# Patient Record
Sex: Female | Born: 1975 | Race: Black or African American | Hispanic: No | Marital: Married | Smoking: Never smoker
Health system: Southern US, Community
[De-identification: ages and names within clinical notes are randomized; demographics above are authoritative.]

## PROBLEM LIST (undated history)

## (undated) ENCOUNTER — Inpatient Hospital Stay (HOSPITAL_COMMUNITY): Payer: Self-pay

## (undated) DIAGNOSIS — O009 Unspecified ectopic pregnancy without intrauterine pregnancy: Secondary | ICD-10-CM

## (undated) DIAGNOSIS — R011 Cardiac murmur, unspecified: Secondary | ICD-10-CM

## (undated) DIAGNOSIS — Z973 Presence of spectacles and contact lenses: Secondary | ICD-10-CM

## (undated) DIAGNOSIS — D259 Leiomyoma of uterus, unspecified: Secondary | ICD-10-CM

## (undated) DIAGNOSIS — Z789 Other specified health status: Secondary | ICD-10-CM

## (undated) HISTORY — PX: NO PAST SURGERIES: SHX2092

---

## 2000-11-04 ENCOUNTER — Encounter: Admission: RE | Admit: 2000-11-04 | Discharge: 2000-11-04 | Payer: Self-pay | Admitting: Family Medicine

## 2000-11-04 ENCOUNTER — Encounter: Payer: Self-pay | Admitting: Family Medicine

## 2002-02-02 ENCOUNTER — Other Ambulatory Visit: Admission: RE | Admit: 2002-02-02 | Discharge: 2002-02-02 | Payer: Self-pay | Admitting: Family Medicine

## 2003-07-06 ENCOUNTER — Other Ambulatory Visit: Admission: RE | Admit: 2003-07-06 | Discharge: 2003-07-06 | Payer: Self-pay | Admitting: Family Medicine

## 2012-09-29 DIAGNOSIS — O009 Unspecified ectopic pregnancy without intrauterine pregnancy: Secondary | ICD-10-CM

## 2012-09-29 HISTORY — DX: Unspecified ectopic pregnancy without intrauterine pregnancy: O00.90

## 2012-10-18 ENCOUNTER — Encounter (HOSPITAL_COMMUNITY): Payer: Self-pay

## 2012-10-18 ENCOUNTER — Inpatient Hospital Stay (HOSPITAL_COMMUNITY): Payer: BC Managed Care – PPO

## 2012-10-18 ENCOUNTER — Inpatient Hospital Stay (HOSPITAL_COMMUNITY)
Admission: AD | Admit: 2012-10-18 | Discharge: 2012-10-18 | Disposition: A | Discharge status: Home / Self Care | Payer: BC Managed Care – PPO | Source: Ambulatory Visit | Attending: Obstetrics & Gynecology | Admitting: Obstetrics & Gynecology

## 2012-10-18 DIAGNOSIS — O209 Hemorrhage in early pregnancy, unspecified: Secondary | ICD-10-CM

## 2012-10-18 DIAGNOSIS — D259 Leiomyoma of uterus, unspecified: Secondary | ICD-10-CM

## 2012-10-18 DIAGNOSIS — R109 Unspecified abdominal pain: Secondary | ICD-10-CM

## 2012-10-18 DIAGNOSIS — O3410 Maternal care for benign tumor of corpus uteri, unspecified trimester: Secondary | ICD-10-CM

## 2012-10-18 HISTORY — DX: Other specified health status: Z78.9

## 2012-10-18 LAB — WET PREP, GENITAL
Clue Cells Wet Prep HPF POC: NONE SEEN
Trich, Wet Prep: NONE SEEN
WBC, Wet Prep HPF POC: NONE SEEN
Yeast Wet Prep HPF POC: NONE SEEN

## 2012-10-18 LAB — POCT PREGNANCY, URINE: Preg Test, Ur: POSITIVE — AB

## 2012-10-18 LAB — ABO/RH: ABO/RH(D): A POS

## 2012-10-18 LAB — HCG, QUANTITATIVE, PREGNANCY: hCG, Beta Chain, Quant, S: 317 m[IU]/mL — ABNORMAL HIGH (ref <5)

## 2012-10-18 NOTE — MAU Note (Signed)
In bathroom when called

## 2012-10-18 NOTE — MAU Note (Signed)
Vag bleeding since Wed.  Couldn't get in to OB/GYN, saw regular primary dr.  Kenard Gower blood, confirmed preg- no [place] in office.  Had really bad cramping last Wed and Thurs, now is mild.

## 2012-10-18 NOTE — MAU Provider Note (Signed)
Attestation of Attending Supervision of Advanced Practitioner (CNM/NP): Evaluation and management procedures were performed by the Advanced Practitioner under my supervision and collaboration.  I have reviewed the Advanced Practitioner's note and chart, and I agree with the management and plan.  HARRAWAY-SMITH, Sebastian Lurz 8:44 PM     

## 2012-10-18 NOTE — MAU Provider Note (Signed)
History     CSN: [id number]  Arrival date and time: 10/18/12 1707   None     Chief Complaint  Patient presents with  . Vaginal Bleeding  . Abdominal Cramping   HPI 37 y.o. G2P0010 at [redacted]w[redacted]d with vaginal bleeding x a few days, getting heavier today, + abdominal pain/cramping. Has had HCG levels drawn in her PCP's office Friday and today, but does not know what those results showed. PCP told her to come here d/t bleeding and pain.   Past Medical History  Diagnosis Date  . No pertinent past medical history     Past Surgical History  Procedure Date  . No past surgeries     Family History  Problem Relation Age of Onset  . Other Neg Hx     History  Substance Use Topics  . Smoking status: Never Smoker   . Smokeless tobacco: Not on file  . Alcohol Use: No    Allergies: No Known Allergies  Prescriptions prior to admission  Medication Sig Dispense Refill  . acetaminophen-codeine (TYLENOL #3) 300-30 MG per tablet Take 1 tablet by mouth every 4 (four) hours as needed.      . Multiple Vitamin (MULTIVITAMIN WITH MINERALS) TABS Take 1 tablet by mouth daily.      . Norgestimate-Ethinyl Estradiol Triphasic (ORTHO TRI-CYCLEN LO) 0.18/0.215/0.25 MG-25 MCG tab Take 1 tablet by mouth daily.        Review of Systems  Constitutional: Negative.   Respiratory: Negative.   Cardiovascular: Negative.   Gastrointestinal: Positive for abdominal pain. Negative for nausea, vomiting, diarrhea and constipation.  Genitourinary: Negative for dysuria, urgency, frequency, hematuria and flank pain.       Positive for vaginal bleeding   Musculoskeletal: Negative.   Neurological: Negative.   Psychiatric/Behavioral: Negative.    Physical Exam   Blood pressure 129/81, pulse 87, temperature 98.4 F (36.9 C), temperature source Oral, resp. rate 18, last menstrual period 08/29/2012.  Physical Exam  Constitutional: She is oriented to person, place, and time. She appears well-developed and  well-nourished. No distress.  HENT:  Head: Normocephalic and atraumatic.  Cardiovascular: Normal rate, regular rhythm and normal heart sounds.   Respiratory: Effort normal and breath sounds normal. No respiratory distress.  GI: Soft. Bowel sounds are normal. She exhibits no distension and no mass. There is no tenderness. There is no rebound and no guarding.  Genitourinary: There is no rash or lesion on the right labia. There is no rash or lesion on the left labia. Uterus is not deviated, not enlarged, not fixed and not tender. Cervix exhibits no motion tenderness, no discharge and no friability. Right adnexum displays no mass, no tenderness and no fullness. Left adnexum displays no mass, no tenderness and no fullness. There is bleeding (moderate) around the vagina. No erythema or tenderness around the vagina. No vaginal discharge found.  Neurological: She is alert and oriented to person, place, and time.  Skin: Skin is warm and dry.  Psychiatric: She has a normal mood and affect.    MAU Course  Procedures  Results for orders placed during the hospital encounter of 10/18/12 (from the past 24 hour(s))  HCG, QUANTITATIVE, PREGNANCY     Status: Abnormal   Collection Time   10/18/12  6:15 PM      Component Value Range   hCG, Beta Chain, Quant, S 317 (*) <5 mIU/mL  ABO/RH     Status: Normal (Preliminary result)   Collection Time   10/18/12  6:15 PM  Component Value Range   ABO/RH(D) A POS    POCT PREGNANCY, URINE     Status: Abnormal   Collection Time   10/18/12  7:02 PM      Component Value Range   Preg Test, Ur POSITIVE (*) NEGATIVE     Assessment and Plan  U/S Pending Care assumed by Wynelle Bourgeois, CNM  [facility] 10/18/2012, 7:42 PM   [place]:  Multiple fibroids        No Gestational sac seen         No YS or fetal pole  Will repeat quant Hcg in 2 days.

## 2012-10-19 LAB — GC/CHLAMYDIA PROBE AMP
CT Probe RNA: NEGATIVE
GC Probe RNA: NEGATIVE

## 2012-10-20 ENCOUNTER — Inpatient Hospital Stay (HOSPITAL_COMMUNITY)
Admission: AD | Admit: 2012-10-20 | Discharge: 2012-10-20 | Disposition: A | Discharge status: Home / Self Care | Payer: BC Managed Care – PPO | Source: Ambulatory Visit | Attending: Obstetrics & Gynecology | Admitting: Obstetrics & Gynecology

## 2012-10-20 DIAGNOSIS — R109 Unspecified abdominal pain: Secondary | ICD-10-CM | POA: Insufficient documentation

## 2012-10-20 DIAGNOSIS — O034 Incomplete spontaneous abortion without complication: Secondary | ICD-10-CM

## 2012-10-20 DIAGNOSIS — O209 Hemorrhage in early pregnancy, unspecified: Secondary | ICD-10-CM | POA: Insufficient documentation

## 2012-10-20 LAB — HCG, QUANTITATIVE, PREGNANCY: hCG, Beta Chain, Quant, S: 369 m[IU]/mL — ABNORMAL HIGH (ref <5)

## 2012-10-20 NOTE — MAU Provider Note (Signed)
  History     CSN: [id number]  Arrival date and time: 10/20/12 1729   None     Chief Complaint  Patient presents with  . Follow-up   HPI This is a 37 y.o. female at [redacted]w[redacted]d who presents for followup Quant HCG. She was seen here two days ago for bleeding and cramping. Quant was 317. Has bled for 7 days, heavier with clots past 2 days.   OB History    Grav Para Term Preterm Abortions TAB SAB Ect Mult Living   2 0 0 0 1 1 0 0 0 0       Past Medical History  Diagnosis Date  . No pertinent past medical history     Past Surgical History  Procedure Date  . No past surgeries     Family History  Problem Relation Age of Onset  . Other Neg Hx     History  Substance Use Topics  . Smoking status: Never Smoker   . Smokeless tobacco: Not on file  . Alcohol Use: No    Allergies: No Known Allergies  Prescriptions prior to admission  Medication Sig Dispense Refill  . acetaminophen-codeine (TYLENOL #3) 300-30 MG per tablet Take 1 tablet by mouth every 4 (four) hours as needed.      . Multiple Vitamin (MULTIVITAMIN WITH MINERALS) TABS Take 1 tablet by mouth daily.      . Norgestimate-Ethinyl Estradiol Triphasic (ORTHO TRI-CYCLEN LO) 0.18/0.215/0.25 MG-25 MCG tab Take 1 tablet by mouth daily.        Review of Systems  Constitutional: Positive for malaise/fatigue. Negative for fever and chills.  Cardiovascular: Negative for chest pain.  Gastrointestinal: Positive for abdominal pain (uterine cramps). Negative for nausea, vomiting, diarrhea and constipation.  Genitourinary: Negative for dysuria.  Neurological: Negative for dizziness and weakness.   Physical Exam   Blood pressure 137/76, pulse 86, temperature 98.5 F (36.9 C), temperature source Oral, resp. rate 16, last menstrual period 08/29/2012, SpO2 100.00%.  Physical Exam  Constitutional: She is oriented to person, place, and time. She appears well-developed and well-nourished. No distress.  Cardiovascular: Normal rate.     Respiratory: Effort normal.  Musculoskeletal: Normal range of motion.  Neurological: She is alert and oriented to person, place, and time.  Skin: Skin is warm and dry.  Psychiatric: She has a normal mood and affect.  Pelvic deferred  MAU Course  Procedures  MDM Results for orders placed during the hospital encounter of 10/20/12 (from the past 24 hour(s))  HCG, QUANTITATIVE, PREGNANCY     Status: Abnormal   Collection Time   10/20/12  5:55 PM      Component Value Range   hCG, Beta Chain, Quant, S 369 (*) <5 mIU/mL   Last quant 317 two days ago.   Assessment and Plan  A:  Pregnancy at [redacted]w[redacted]d by LMP      Nothing seen in uterus on [place] two days ago      Insufficient rise in Quantitative HCG levels  P:  Discussed poor prognosis with pt and FOB      Offered expectant mgmt vs Cytotec, pt will continue expectant for now (I suspect process may be over soon)       Repeat Quant and CBC on Monday.       Bleeding precautions:  If heavier, come in sooner than Monday  [facility] 10/20/2012, 7:34 PM

## 2012-10-20 NOTE — MAU Note (Signed)
Patient to MAU for repeat BHCG. Patient states she has changed 3 pads today, 5 yesterday with multiple blood clots moderate in size. Having cramping off and on.

## 2012-10-25 ENCOUNTER — Inpatient Hospital Stay (HOSPITAL_COMMUNITY): Payer: BC Managed Care – PPO

## 2012-10-25 ENCOUNTER — Inpatient Hospital Stay (HOSPITAL_COMMUNITY)
Admission: AD | Admit: 2012-10-25 | Discharge: 2012-10-25 | Disposition: A | Discharge status: Home / Self Care | Payer: BC Managed Care – PPO | Source: Ambulatory Visit | Attending: Obstetrics & Gynecology | Admitting: Obstetrics & Gynecology

## 2012-10-25 DIAGNOSIS — O0281 Inappropriate change in quantitative human chorionic gonadotropin (hCG) in early pregnancy: Secondary | ICD-10-CM

## 2012-10-25 DIAGNOSIS — O99891 Other specified diseases and conditions complicating pregnancy: Secondary | ICD-10-CM | POA: Insufficient documentation

## 2012-10-25 DIAGNOSIS — O00109 Unspecified tubal pregnancy without intrauterine pregnancy: Secondary | ICD-10-CM | POA: Insufficient documentation

## 2012-10-25 DIAGNOSIS — O009 Unspecified ectopic pregnancy without intrauterine pregnancy: Secondary | ICD-10-CM

## 2012-10-25 LAB — AST: AST: 20 U/L (ref 0–37)

## 2012-10-25 LAB — HCG, QUANTITATIVE, PREGNANCY: hCG, Beta Chain, Quant, S: 291 m[IU]/mL — ABNORMAL HIGH (ref <5)

## 2012-10-25 LAB — DIFFERENTIAL
Basophils Relative: 0 % (ref 0–1)
Eosinophils Absolute: 0.1 10*3/uL (ref 0.0–0.7)
Eosinophils Relative: 1 % (ref 0–5)
Lymphs Abs: 2 10*3/uL (ref 0.7–4.0)
Monocytes Absolute: 0.6 10*3/uL (ref 0.1–1.0)
Monocytes Relative: 10 % (ref 3–12)

## 2012-10-25 LAB — CBC
HCT: 35.5 % — ABNORMAL LOW (ref 36.0–46.0)
Platelets: 403 10*3/uL — ABNORMAL HIGH (ref 150–400)
RDW: 13.2 % (ref 11.5–15.5)
WBC: 6.4 10*3/uL (ref 4.0–10.5)

## 2012-10-25 LAB — BUN: BUN: 11 mg/dL (ref 6–23)

## 2012-10-25 LAB — CREATININE, SERUM: Creatinine, Ser: 0.87 mg/dL (ref 0.50–1.10)

## 2012-10-25 MED ORDER — METHOTREXATE INJECTION FOR WOMEN'S HOSPITAL
50.0000 mg/m2 | Freq: Once | INTRAMUSCULAR | Status: AC
  Administered 2012-10-25: 85 mg via INTRAMUSCULAR
  Filled 2012-10-25: qty 1.7

## 2012-10-25 NOTE — MAU Provider Note (Signed)
Attestation of Attending Supervision of Advanced Practitioner (CNM/NP): Evaluation and management procedures were performed by the Advanced Practitioner under my supervision and collaboration. I have reviewed the Advanced Practitioner's note and chart, and I agree with the management and plan.  Phares Zaccone H. 10:44 PM   

## 2012-10-25 NOTE — MAU Provider Note (Signed)
Chief Complaint: Follow-up   None    SUBJECTIVE HPI: Darlene Berg is a 37 y.o. G2P0010 at [redacted]w[redacted]d by LMP who presents to maternity admissions for f/u quant hcg.  Today she reports mild back pain and denies abdominal pain, n/v, fever/chills, or vaginal bleeding.  She did report bright red vaginal bleeding x5 days which resolved on 1/25.     Past Medical History  Diagnosis Date  . No pertinent past medical history    Past Surgical History  Procedure Date  . No past surgeries    History   Social History  . Marital Status: Single    Spouse Name: N/A    Number of Children: N/A  . Years of Education: N/A   Occupational History  . Not on file.   Social History Main Topics  . Smoking status: Never Smoker   . Smokeless tobacco: Not on file  . Alcohol Use: No  . Drug Use: No  . Sexually Active: Yes    Birth Control/ Protection: Pill   Other Topics Concern  . Not on file   Social History Narrative  . No narrative on file   No current facility-administered medications on file prior to encounter.   Current Outpatient Prescriptions on File Prior to Encounter  Medication Sig Dispense Refill  . acetaminophen-codeine (TYLENOL #3) 300-30 MG per tablet Take 1 tablet by mouth every 4 (four) hours as needed.      . Multiple Vitamin (MULTIVITAMIN WITH MINERALS) TABS Take 1 tablet by mouth daily.       No Known Allergies  ROS: Pertinent items in HPI  OBJECTIVE Blood pressure 113/61, pulse 83, temperature 98.4 F (36.9 C), temperature source Oral, resp. rate 16, last menstrual period 08/29/2012, SpO2 100.00%. GENERAL: Well-developed, well-nourished female in no acute distress.  HEENT: Normocephalic HEART: normal rate RESP: normal effort ABDOMEN: Soft, non-tender EXTREMITIES: Nontender, no edema NEURO: Alert and oriented SPECULUM EXAM: Deferred--done on 10/18/12  LAB RESULTS  Quant hcg results 1/20: 317 Quant hcg results 1/22: 369   Results for orders placed during the  hospital encounter of 10/25/12 (from the past 24 hour(s))  HCG, QUANTITATIVE, PREGNANCY     Status: Abnormal   Collection Time   10/25/12  5:45 PM      Component Value Range   hCG, Beta Chain, Quant, S 291 (*) <5 mIU/mL  CBC     Status: Abnormal   Collection Time   10/25/12  5:52 PM      Component Value Range   WBC 6.4  4.0 - 10.5 K/uL   RBC 3.84 (*) 3.87 - 5.11 MIL/uL   Hemoglobin 11.4 (*) 12.0 - 15.0 g/dL   HCT 16.1 (*) 09.6 - 04.5 %   MCV 92.4  78.0 - 100.0 fL   MCH 29.7  26.0 - 34.0 pg   MCHC 32.1  30.0 - 36.0 g/dL   RDW 40.9  81.1 - 91.4 %   Platelets 403 (*) 150 - 400 K/uL    IMAGING   US Ob Transvaginal  10/25/2012  *RADIOLOGY REPORT*  Clinical Data: Abdominal pain, bleeding, beta HCG 291 (previously 369 on 10/20/2012)  TRANSVAGINAL OBSTETRIC US  Technique:  Transvaginal ultrasound was performed for complete evaluation of the gestation as well as the maternal uterus, adnexal regions, and pelvic cul-de-sac.  Comparison:  10/19/2011.  Intrauterine gestational sac: Not visualized.  Maternal uterus/adnexae: Uterus is notable for uterine fibroids.  Endometrial complex measures 6 mm.  Left ovary is within normal limits, measuring 1.7 x  3.2 x 2.3 cm.  Right ovary is within normal limits, measuring 1.8 x 3.4 x 2.1 cm.  Small volume pelvic ascites.  IMPRESSION: No IUP is visualized.  This reflects a pregnancy of unknown location.  Differential considerations include abnormal IUP (favored) or less likely nonvisualized ectopic pregnancy.  Early normal IUP is not considered likely in the setting of a declining beta HCG.  Follow up beta HCG is suggested, with repeat sonography if clinically warranted.   Original Report Authenticated By: Charline Bills, M.D.      ASSESSMENT 1. Inappropriate change in quantitative hCG in early pregnancy   2. Ectopic pregnancy     PLAN Discussed assessment and results with Dr Penne Lash Methotrexate offered for probable ectopic pregnancy Discussed ectopic  pregnancy, risks, and methotrexate benefits/risks with pt Care assumed by [facility], NP  Sharen Counter Certified Nurse-Midwife 10/25/2012  8:53 PM  Results for orders placed during the hospital encounter of 10/25/12 (from the past 24 hour(s))  HCG, QUANTITATIVE, PREGNANCY     Status: Abnormal   Collection Time   10/25/12  5:45 PM      Component Value Range   hCG, Beta Chain, Quant, S 291 (*) <5 mIU/mL  CBC     Status: Abnormal   Collection Time   10/25/12  5:52 PM      Component Value Range   WBC 6.4  4.0 - 10.5 K/uL   RBC 3.84 (*) 3.87 - 5.11 MIL/uL   Hemoglobin 11.4 (*) 12.0 - 15.0 g/dL   HCT 14.7 (*) 82.9 - 56.2 %   MCV 92.4  78.0 - 100.0 fL   MCH 29.7  26.0 - 34.0 pg   MCHC 32.1  30.0 - 36.0 g/dL   RDW 13.0  86.5 - 78.4 %   Platelets 403 (*) 150 - 400 K/uL  DIFFERENTIAL     Status: Normal   Collection Time   10/25/12  5:52 PM      Component Value Range   Neutrophils Relative 58  43 - 77 %   Neutro Abs 3.7  1.7 - 7.7 K/uL   Lymphocytes Relative 31  12 - 46 %   Lymphs Abs 2.0  0.7 - 4.0 K/uL   Monocytes Relative 10  3 - 12 %   Monocytes Absolute 0.6  0.1 - 1.0 K/uL   Eosinophils Relative 1  0 - 5 %   Eosinophils Absolute 0.1  0.0 - 0.7 K/uL   Basophils Relative 0  0 - 1 %   Basophils Absolute 0.0  0.0 - 0.1 K/uL  AST     Status: Normal   Collection Time   10/25/12  5:54 PM      Component Value Range   AST 20  0 - 37 U/L  BUN     Status: Normal   Collection Time   10/25/12  5:54 PM      Component Value Range   BUN 11  6 - 23 mg/dL  CREATININE, SERUM     Status: Abnormal   Collection Time   10/25/12  5:54 PM      Component Value Range   Creatinine, Ser 0.87  0.50 - 1.10 mg/dL   GFR calc non Af Amer 85 (*) >90 mL/min   GFR calc Af Amer >90  >90 mL/min

## 2012-10-25 NOTE — MAU Note (Signed)
Patient to MAU for repeat BHCG and CBC. States she has stopped bleeding on 1-25. Has slight back ache, no cramping.

## 2012-10-25 NOTE — MAU Note (Signed)
Orders done per previous visit on 1-22 per M. Mayford Knife, CNM

## 2012-10-28 ENCOUNTER — Inpatient Hospital Stay (HOSPITAL_COMMUNITY)
Admission: AD | Admit: 2012-10-28 | Discharge: 2012-10-28 | Disposition: A | Discharge status: Home / Self Care | Payer: BC Managed Care – PPO | Source: Ambulatory Visit | Attending: Obstetrics & Gynecology | Admitting: Obstetrics & Gynecology

## 2012-10-28 DIAGNOSIS — O00109 Unspecified tubal pregnancy without intrauterine pregnancy: Secondary | ICD-10-CM | POA: Insufficient documentation

## 2012-10-28 NOTE — MAU Note (Signed)
Pt states here for repeat BHCG post MTX, denies pain or bleeding.

## 2012-10-28 NOTE — MAU Provider Note (Signed)
S: 37 y.o. G2P0010 @[redacted]w[redacted]d  by LMP presents to MAU on day 4 of Methotrexate treatment.  Today she denies abdominal or back pain, vaginal bleeding, n/v, or fever/chills.  O: BP 116/70  Pulse 85  Temp 97.2 F (36.2 C) (Oral)  Resp 18  LMP 08/29/2012  Quant hcg results: 1/20 317 1/22 369 1/27 291 (Day 1 of Methotrexate)  Results for orders placed during the hospital encounter of 10/28/12 (from the past 24 hour(s))  HCG, QUANTITATIVE, PREGNANCY     Status: Abnormal   Collection Time   10/28/12  5:57 PM      Component Value Range   hCG, Beta Chain, Quant, S 263 (*) <5 mIU/mL    A: Appropriate hcg for Day 4 of Methotrexate  P: D/C home Return as scheduled on Day 7 for quant hcg Return to MAU sooner as needed   Sharen Counter Certified Nurse-Midwife

## 2012-11-01 ENCOUNTER — Inpatient Hospital Stay (HOSPITAL_COMMUNITY)
Admission: AD | Admit: 2012-11-01 | Discharge: 2012-11-01 | Disposition: A | Discharge status: Home / Self Care | Payer: BC Managed Care – PPO | Source: Ambulatory Visit | Attending: Obstetrics & Gynecology | Admitting: Obstetrics & Gynecology

## 2012-11-01 DIAGNOSIS — O00109 Unspecified tubal pregnancy without intrauterine pregnancy: Secondary | ICD-10-CM | POA: Insufficient documentation

## 2012-11-01 NOTE — MAU Note (Signed)
Patient to MAU for day 7 BHCG s/p MTX. Patient denies any pain or bleeding.  

## 2012-11-01 NOTE — Progress Notes (Addendum)
Darlene Berg   Visit Date: 11/01/2012   S:  Patient seen in MAU today for follow-up quantitative HCG following methotrexate on 10/25/12 for pregnancy of unknown location (no intrauterine pregnancy seen).  Pt denies abdominal pain today. No nausea, vomiting, fever, chills. Her vaginal bleeding is minimal to moderate today.  O:   Filed Vitals:   11/01/12 1813  BP: 123/52  Pulse: 79  Temp: 98.3 F (36.8 C)  Resp: 16    GEN:  WNWD, no distress HEENT:  NCAT, EOMI ABD:  Non-tender EXTREM:  No edema, non-tender  Results for orders placed during the hospital encounter of 11/01/12 (from the past 24 hour(s))  HCG, QUANTITATIVE, PREGNANCY     Status: Abnormal   Collection Time   11/01/12  5:45 PM      Component Value Range   hCG, Beta Chain, Quant, S 156 (*) <5 mIU/mL   1/22:   369 1/27  291 1/30   263 2/3  156  A/P 37 y.o. G2P0010 with presumed ectopic pregnancy s/p Methotrexate - appropriate decrease in b-HCG. - No abdominal pain - F/U in 1 week for repeat HCG. - Pt advised to come to hospital immediately if abdominal pain or heavy bleeding.  Napoleon Form, MD 11/01/2012 8:36 PM   Spoke with patient by phone to let her know results of today's labs. She will return in one week (11/08/12) for repeat B-hcg.  Napoleon Form, MD 11/01/2012 8:42 PM

## 2012-11-08 ENCOUNTER — Inpatient Hospital Stay (HOSPITAL_COMMUNITY)
Admission: AD | Admit: 2012-11-08 | Discharge: 2012-11-08 | Disposition: A | Discharge status: Home / Self Care | Payer: BC Managed Care – PPO | Source: Ambulatory Visit | Attending: Obstetrics & Gynecology | Admitting: Obstetrics & Gynecology

## 2012-11-08 DIAGNOSIS — O009 Unspecified ectopic pregnancy without intrauterine pregnancy: Secondary | ICD-10-CM

## 2012-11-08 DIAGNOSIS — O00109 Unspecified tubal pregnancy without intrauterine pregnancy: Secondary | ICD-10-CM

## 2012-11-08 NOTE — MAU Note (Signed)
Patient to MAU for BCHG weekly after MTX. Denies pain or bleeding.

## 2012-11-08 NOTE — MAU Provider Note (Signed)
Pt can follow up in office [facility] clinic) in one week.

## 2012-11-08 NOTE — MAU Provider Note (Signed)
37 y.o. G2P0010 here for second weekly quant HCG following MTX for ectopic pregnancy. Denies pain or bleeding. Quant 156 1 week ago.   BP 117/67  Pulse 93  Temp(Src) 98.3 F (36.8 C) (Oral)  Resp 16  SpO2 100%  LMP 08/29/2012  Physical Exam  Nursing note and vitals reviewed. Constitutional: She is oriented to person, place, and time. She appears well-developed and well-nourished. No distress.  Cardiovascular: Normal rate.   Pulmonary/Chest: Effort normal.  Musculoskeletal: Normal range of motion.  Neurological: She is alert and oriented to person, place, and time.  Skin: Skin is warm.   Results for orders placed during the hospital encounter of 11/08/12 (from the past 24 hour(s))  HCG, QUANTITATIVE, PREGNANCY     Status: Abnormal   Collection Time    11/08/12  5:45 PM      Result Value Range   hCG, Beta Chain, Quant, S 61 (*) <5 mIU/mL    A/P: 1. Ectopic pregnancy      Follow-up Information   Follow up with THE [facility] [place] MATERNITY ADMISSIONS In 1 week.   Contact information:   [address] [id number] [place] [address] [phone]

## 2012-11-15 ENCOUNTER — Encounter: Payer: BC Managed Care – PPO | Admitting: Obstetrics and Gynecology

## 2012-11-22 ENCOUNTER — Inpatient Hospital Stay (HOSPITAL_COMMUNITY)
Admission: AD | Admit: 2012-11-22 | Discharge: 2012-11-22 | Disposition: A | Discharge status: Home / Self Care | Payer: BC Managed Care – PPO | Source: Ambulatory Visit | Attending: Obstetrics & Gynecology | Admitting: Obstetrics & Gynecology

## 2012-11-22 DIAGNOSIS — O00109 Unspecified tubal pregnancy without intrauterine pregnancy: Secondary | ICD-10-CM | POA: Insufficient documentation

## 2012-11-22 DIAGNOSIS — Z3200 Encounter for pregnancy test, result unknown: Secondary | ICD-10-CM

## 2012-11-22 NOTE — MAU Note (Signed)
Here for weekly BHCG, did not come last week, has left side pain similar to bra being too tight, [facility] to see and then pt will go home and called with results, lab drawn No pain , no vaginal bleeding

## 2012-11-22 NOTE — MAU Provider Note (Signed)
Darlene Berg is a 37 y.o. female who presents to MAU for follow up Bhcg. She is having mild upper abdominal pain where her bra rubs against her skin but no lower abdominal pain or other problems.   BP 122/77  Pulse 77  Temp(Src) 98.6 F (37 C) (Oral)  Resp 18  SpO2 100%  LMP 08/29/2012   Results for orders placed during the hospital encounter of 11/22/12 (from the past 24 hour(s))  HCG, QUANTITATIVE, PREGNANCY     Status: Abnormal   Collection Time    11/22/12  6:23 PM      Result Value Range   hCG, Beta Chain, Quant, S 33 (*) <5 mIU/mL    Results discussed with the patient and plan of care.   Will have patient follow up in GYN Clinic in one week.  E-mail sent to clinic.

## 2012-11-30 ENCOUNTER — Other Ambulatory Visit: Payer: BC Managed Care – PPO

## 2013-08-23 ENCOUNTER — Encounter (HOSPITAL_COMMUNITY): Payer: Self-pay | Admitting: *Deleted

## 2013-12-03 ENCOUNTER — Emergency Department (HOSPITAL_COMMUNITY): Payer: BC Managed Care – PPO

## 2013-12-03 ENCOUNTER — Emergency Department (HOSPITAL_COMMUNITY)
Admission: EM | Admit: 2013-12-03 | Discharge: 2013-12-03 | Disposition: A | Discharge status: Home / Self Care | Payer: BC Managed Care – PPO | Attending: Emergency Medicine | Admitting: Emergency Medicine

## 2013-12-03 ENCOUNTER — Encounter (HOSPITAL_COMMUNITY): Payer: Self-pay | Admitting: Emergency Medicine

## 2013-12-03 DIAGNOSIS — Z79899 Other long term (current) drug therapy: Secondary | ICD-10-CM | POA: Insufficient documentation

## 2013-12-03 DIAGNOSIS — J069 Acute upper respiratory infection, unspecified: Secondary | ICD-10-CM | POA: Insufficient documentation

## 2013-12-03 MED ORDER — OXYMETAZOLINE HCL 0.05 % NA SOLN: 1.0000 | Freq: Two times a day (BID) | NASAL | Status: DC | PRN

## 2013-12-03 MED ORDER — OXYMETAZOLINE HCL 0.05 % NA SOLN
1.0000 | Freq: Once | NASAL | Status: AC
  Administered 2013-12-03: 1 via NASAL
  Filled 2013-12-03: qty 15

## 2013-12-03 NOTE — Discharge Instructions (Signed)
Cough, Adult  A cough is a reflex. It helps you clear your throat and airways. A cough can help heal your body. A cough can last 2 or 3 weeks (acute) or may last more than 8 weeks (chronic). Some common causes of a cough can include an infection, allergy, or a cold. HOME CARE  Only take medicine as told by your doctor.  If given, take your medicines (antibiotics) as told. Finish them even if you start to feel better.  Use a cold steam vaporizer or humidier in your home. This can help loosen thick spit (secretions).  Sleep so you are almost sitting up (semi-upright). Use pillows to do this. This helps reduce coughing.  Rest as needed.  Stop smoking if you smoke. GET HELP RIGHT AWAY IF:  You have yellowish-white fluid (pus) in your thick spit.  Your cough gets worse.  Your medicine does not reduce coughing, and you are losing sleep.  You cough up blood.  You have trouble breathing.  Your pain gets worse and medicine does not help.  You have a fever. MAKE SURE YOU:   Understand these instructions.  Will watch your condition.  Will get help right away if you are not doing well or get worse. Document Released: 05/29/2011 Document Revised: 12/08/2011 Document Reviewed: 05/29/2011 [facility] Patient Information 2014 [place]. Use the Afrin 2 times daily for 2 days to help with the nasal congestion

## 2013-12-03 NOTE — ED Provider Notes (Signed)
CSN: [id number]     Arrival date & time 12/03/13  0021 History   First MD Initiated Contact with Patient 12/03/13 0110     Chief Complaint  Patient presents with  . cold and congestion      (Consider location/radiation/quality/duration/timing/severity/associated sxs/prior Treatment) HPI Comments: Patient with URI for the past 5 days Tonight when he went to bed sh e had to breath through her mouth and this concerned her. She has had a post nasal drip causing a cough at night She has taken no medication Denies fever, headache   The history is provided by the patient.    Past Medical History  Diagnosis Date  . No pertinent past medical history    Past Surgical History  Procedure Laterality Date  . No past surgeries     Family History  Problem Relation Age of Onset  . Other Neg Hx    History  Substance Use Topics  . Smoking status: Never Smoker   . Smokeless tobacco: Not on file  . Alcohol Use: No   OB History   Grav Para Term Preterm Abortions TAB SAB Ect Mult Living   2 0 0 0 1 1 0 0 0 0      Review of Systems  Constitutional: Negative for fever and chills.  HENT: Positive for congestion and postnasal drip. Negative for sore throat.   Respiratory: Positive for cough. Negative for shortness of breath and wheezing.   Skin: Negative for rash.  Neurological: Negative for dizziness and headaches.  All other systems reviewed and are negative.      Allergies  Review of patient's allergies indicates no known allergies.  Home Medications   Current Outpatient Rx  Name  Route  Sig  Dispense  Refill  . Multiple Vitamin (MULTIVITAMIN WITH MINERALS) TABS tablet   Oral   Take 1 tablet by mouth daily.         . Norgestimate-Ethinyl Estradiol Triphasic (ORTHO TRI-CYCLEN LO) 0.18/0.215/0.25 MG-25 MCG tab   Oral   Take 1 tablet by mouth daily.         Marland Kitchen oxymetazoline (AFRIN) 0.05 % nasal spray   Each Nare   Place 1 spray into both nostrils 2 (two) times daily as needed  for congestion.   30 mL   0    BP 122/80  Pulse 85  Temp(Src) 98.1 F (36.7 C)  Resp 18  Ht 5\' 2"  (1.575 m)  Wt 162 lb (73.483 kg)  BMI 29.62 kg/m2  SpO2 95%  LMP 11/30/2013 Physical Exam  Nursing note and vitals reviewed. Constitutional: She appears well-nourished.  HENT:  Head: Normocephalic.  Right Ear: External ear normal.  Left Ear: External ear normal.  Mouth/Throat: Oropharynx is clear and moist.  Eyes: Pupils are equal, round, and reactive to light.  Neck: Normal range of motion.  Cardiovascular: Normal rate and regular rhythm.   Pulmonary/Chest: Effort normal and breath sounds normal. She has no wheezes.  Musculoskeletal: Normal range of motion.  Neurological: She is alert.  Skin: Skin is warm and dry. No erythema.    ED Course  Procedures (including critical care time) Labs Review Labs Reviewed - No data to display Imaging Review Dg Chest 2 View  12/03/2013   CLINICAL DATA:  Chest congestion, cough.  EXAM: CHEST  2 VIEW  COMPARISON:  None.  FINDINGS: The cardiac and mediastinal silhouettes are within normal limits.  The lungs are normally inflated. No airspace consolidation, pleural effusion, or pulmonary edema is identified. There is  no pneumothorax.  No acute osseous abnormality identified.  IMPRESSION: No active cardiopulmonary disease.   Electronically Signed   By: Jeannine Boga M.D.   On: 12/03/2013 01:40     EKG Interpretation None      MDM  Will give Afrin for symptom relief Final diagnoses:  URI (upper respiratory infection)         Garald Balding, NP 12/03/13 5726

## 2013-12-03 NOTE — ED Notes (Signed)
The pt has had a cold  With chest congestion since Monday.  No cough .  She feels like that she cannot breathe when she lies down at night.  Unknown if she has had a temp.  lmp 2 days ago

## 2013-12-04 NOTE — ED Provider Notes (Signed)
Medical screening examination/treatment/procedure(s) were performed by non-physician practitioner and as supervising physician I was immediately available for consultation/collaboration.   Teressa Lower, MD 12/04/13 [phone]

## 2013-12-27 IMAGING — US US OB TRANSVAGINAL
1 series · 13 of 28 positions shown · non-contrast
Comparison: none

[Series 1: us ob comp less 14 wks · 13 of 45 slices shown]
[im 2/45]
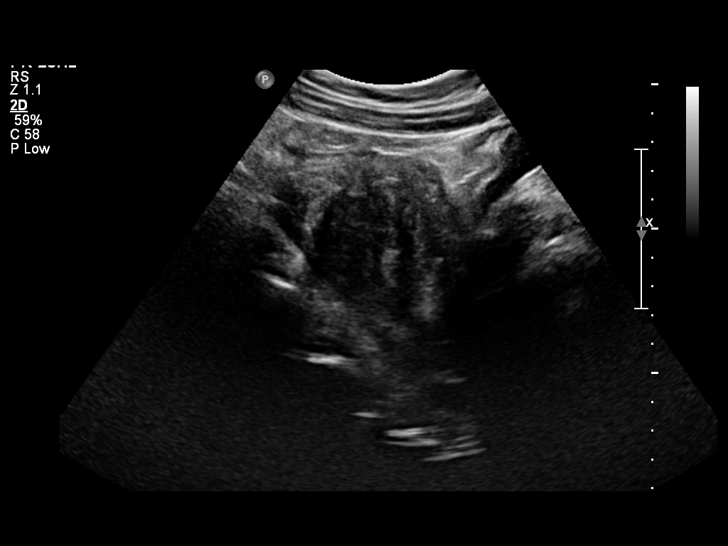
[im 5/45]
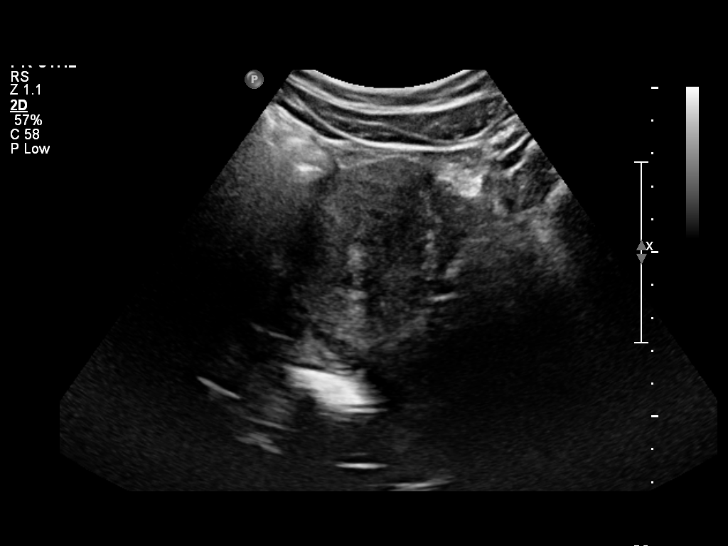
[im 9/45]
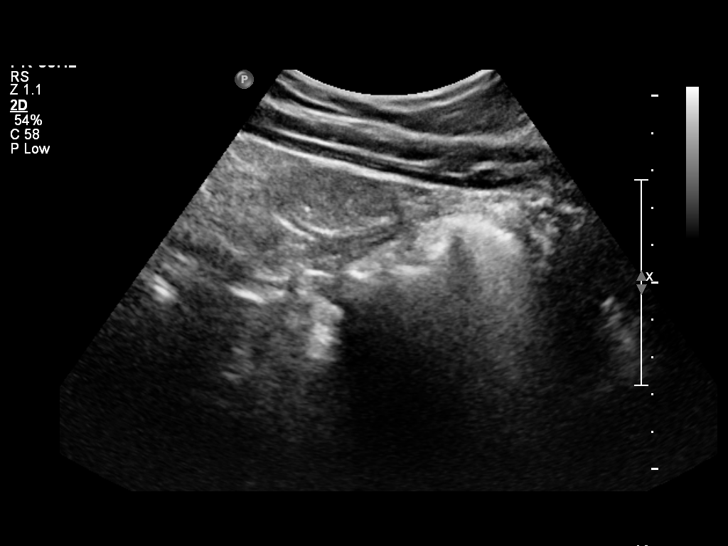
[im 12/45]
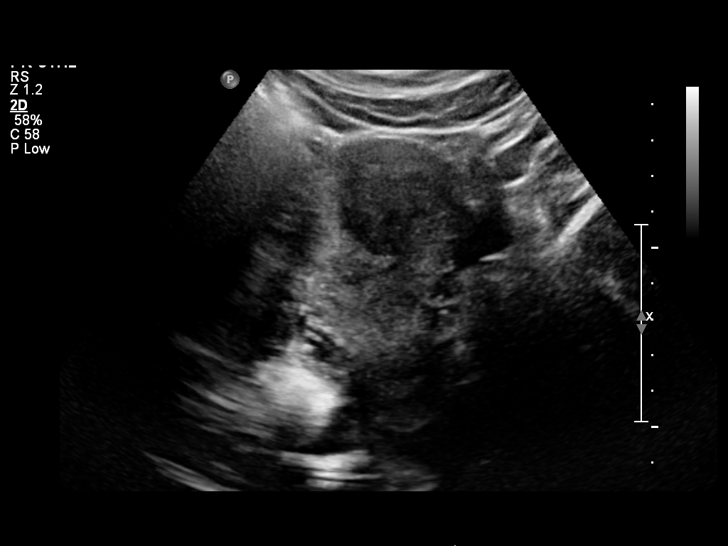
[im 15/45]
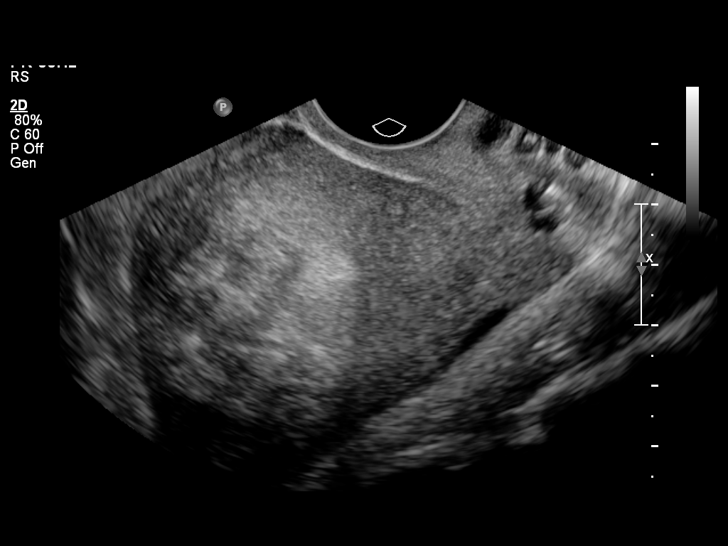
[im 18/45]
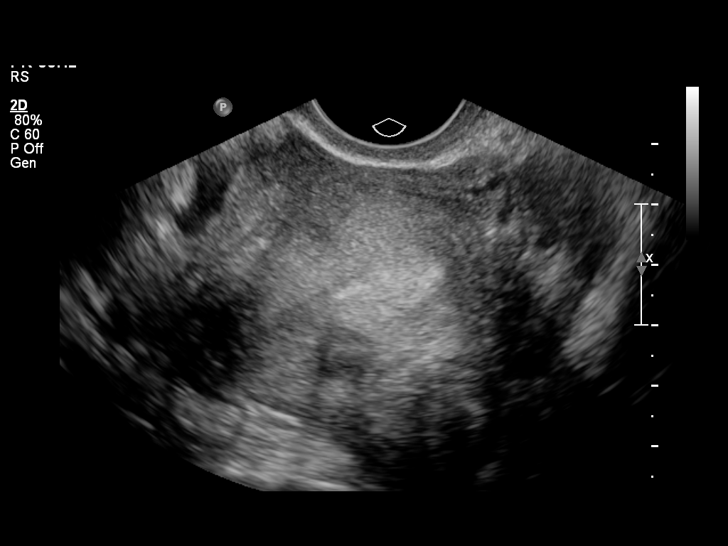
[im 23/45]
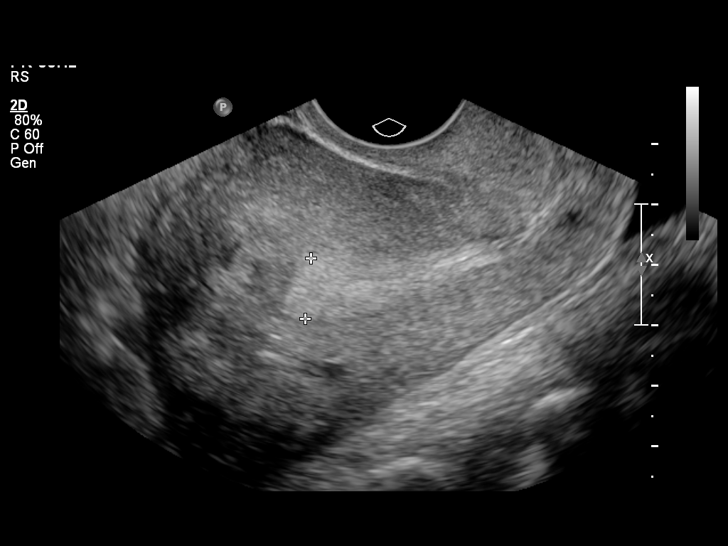
[im 27/45]
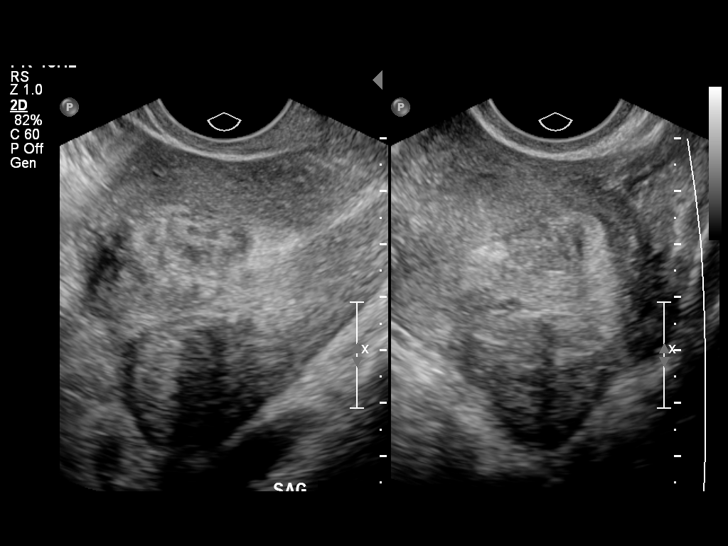
[im 30/45]
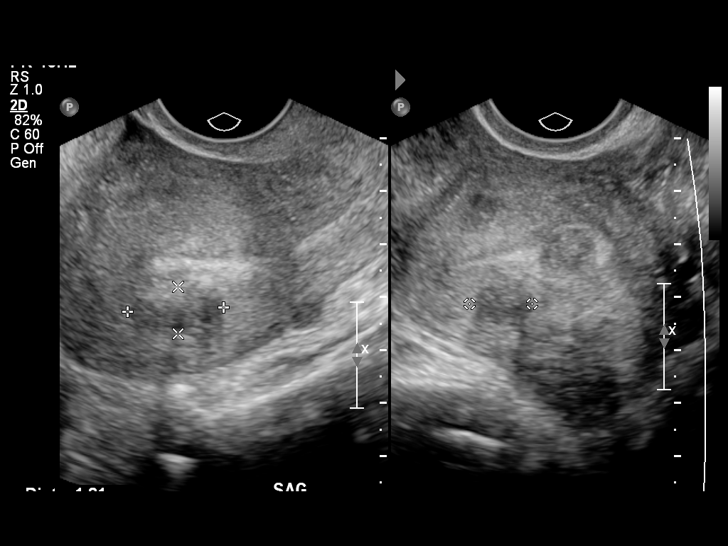
[im 33/45]
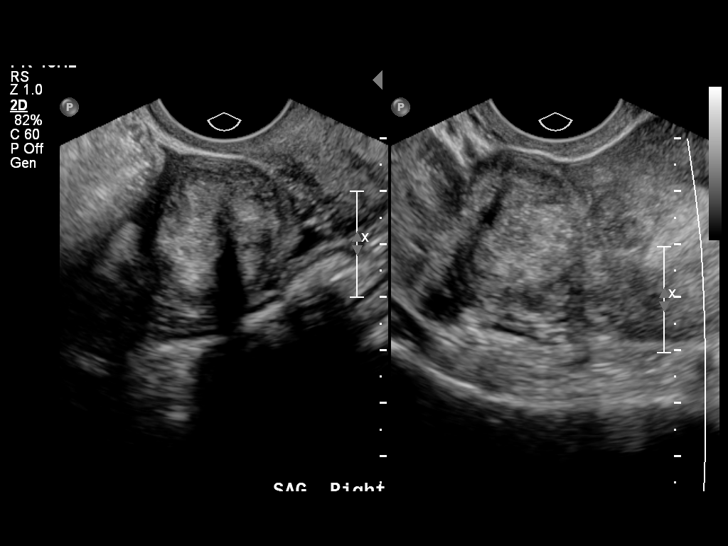
[im 36/45]
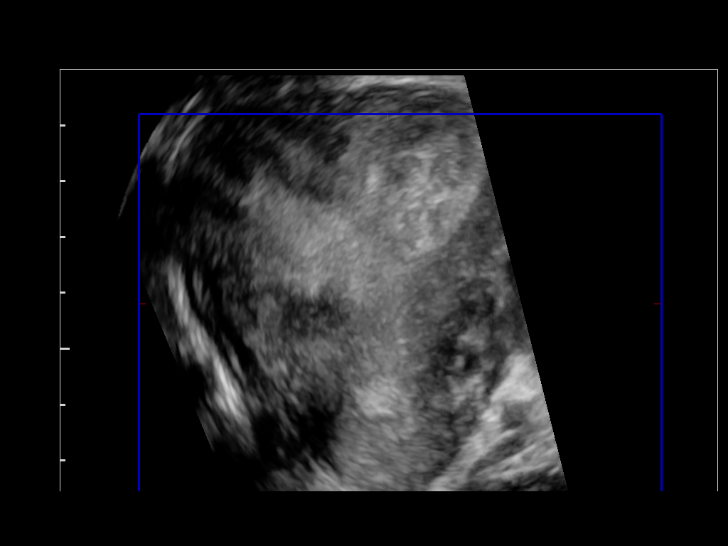
[im 40/45]
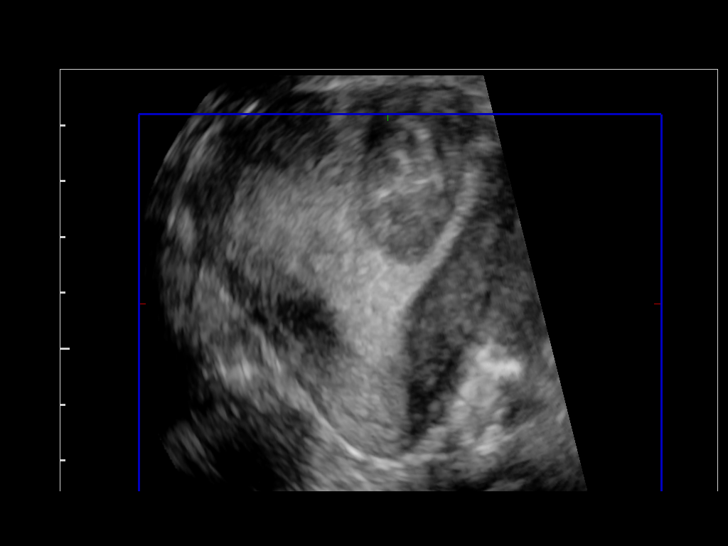
[im 43/45]
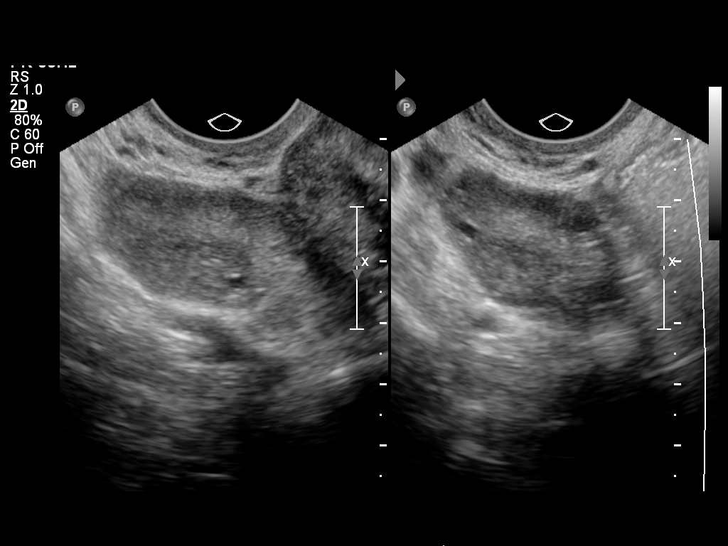

[13 of 28 positions shown; findings below may reference images not displayed]

OBSTETRICS REPORT
                    (Corrected Final 10/19/2012 [DATE])

Service(s) Provided

 US OB COMP LESS 14 WKS                                76801.0
 US OB TRANSVAGINAL                                    76817.0
Indications

 Vaginal bleeding, unknown etiology
Fetal Evaluation

 Num Of Fetuses:    1
 Gest. Sac:         None seen
 Yolk Sac:          Not visualized
 Fetal Pole:        Not visualized
 Cardiac Activity:  No embryo visualized
Cervix Uterus Adnexa

 Cervix:       Normal appearance by transvaginal scan
 Uterus:       Multiple fibroids noted, see table below.
 Cul De Sac:   Trace amount of free fluid seen.
 Left Ovary:    Within normal limits.
 Right Ovary:   Within normal limits.

 Adnexa:     No abnormality visualized.
Myomas

 Site                     L(cm)      W(cm)       D(cm)      Location
 Left fundus              2.2        2.1         1.4        Submucosal
 Left fundus              2.5        2.2         2.5        Subserosal
 Mid                      1.8        1.2         0.9        Intramural
 Right fundus             2          1.7         2.1        Subserosal
 Right LUS                2.6        2.4         2.5        Subserosal

 Blood Flow                  RI       PI       Comments

Impression

 Multiple small fibroids.
 No IUP or adnexal mass identified.  DDx includes recent
 SAB, IUP too early to visualize, or occult ectopic pregnancy.
 Recommend close f/u of quantitative BHCG levels, and
 followup US as clinically warranted.

                 Attending Physician, BARBET

## 2014-07-31 ENCOUNTER — Encounter (HOSPITAL_COMMUNITY): Payer: Self-pay | Admitting: Emergency Medicine

## 2014-11-02 ENCOUNTER — Encounter (HOSPITAL_COMMUNITY): Payer: Self-pay | Admitting: Emergency Medicine

## 2014-11-02 ENCOUNTER — Emergency Department (INDEPENDENT_AMBULATORY_CARE_PROVIDER_SITE_OTHER)
Admission: EM | Admit: 2014-11-02 | Discharge: 2014-11-02 | Disposition: A | Discharge status: Home / Self Care | Payer: BLUE CROSS/BLUE SHIELD | Source: Home / Self Care | Attending: Emergency Medicine | Admitting: Emergency Medicine

## 2014-11-02 DIAGNOSIS — L299 Pruritus, unspecified: Secondary | ICD-10-CM

## 2014-11-02 MED ORDER — TRIAMCINOLONE ACETONIDE 0.1 % EX CREA: 1.0000 "application " | TOPICAL_CREAM | Freq: Three times a day (TID) | CUTANEOUS | Status: DC

## 2014-11-02 MED ORDER — METHYLPREDNISOLONE ACETATE 80 MG/ML IJ SUSP
INTRAMUSCULAR | Status: AC
  Filled 2014-11-02: qty 1

## 2014-11-02 MED ORDER — METHYLPREDNISOLONE ACETATE 80 MG/ML IJ SUSP
80.0000 mg | Freq: Once | INTRAMUSCULAR | Status: AC
  Administered 2014-11-02: 80 mg via INTRAMUSCULAR

## 2014-11-02 MED ORDER — PREDNISONE 20 MG PO TABS: ORAL_TABLET | ORAL | Status: DC

## 2014-11-02 MED ORDER — DOXEPIN HCL 10 MG PO CAPS: 10.0000 mg | ORAL_CAPSULE | Freq: Two times a day (BID) | ORAL | Status: DC

## 2014-11-02 NOTE — ED Provider Notes (Signed)
Chief Complaint    Pruritis   History of Present Illness      Darlene Berg is a 39 year old female who had some hair dye applied at her [facility] a [facility] 2 weeks ago and it turned out that this was the hair dye that she was allergic to. She states her beautician put on air. This caused her scalp to break out and get "pussy and oozing." [facility] to the [facility] on [facility] and was diagnosed with folliculitis. She was given doxycycline. She states this caused her to break out in hives the next day. She went back again to [facility] and they took her off the doxycycline and prescribed prednisolone syrup and hydroxyzine. The patient states the hydroxyzine made her itching worse. She only took one dose of the prednisolone. Her scalp is now cleared up. The patient states that since she's been to [facility] she's had rash on her entire body which is extremely itchy. She can't sleep at night due to itching. She denies any difficulty breathing or fever.  Review of Systems   Other than as noted above, the patient denies any of the following symptoms: Systemic:  No fever or chills. ENT:  No nasal congestion, rhinorrhea, sore throat, swelling of lips, tongue or throat. Resp:  No cough, wheezing, or shortness of breath.  [name]    Past medical history, family history, social history, meds, and allergies were reviewed.   Physical Exam     Vital signs:  BP 138/85 mmHg  Pulse 89  Temp(Src) 97.9 F (36.6 C) (Oral)  Resp 16  SpO2 100%  LMP 10/10/2014  Breastfeeding? No Gen:  Alert, oriented, in no distress. ENT:  Pharynx clear, no intraoral lesions, moist mucous membranes. Lungs:  Clear to auscultation. Skin:  On exam there is no rash on her body. She has multiple excoriated areas on her arms, back, and trunk. The scalp is cleared up completely.  Course in [facility]     The following meds were given:  Medications  methylPREDNISolone acetate (DEPO-MEDROL) injection 80  mg (80 mg Intramuscular Given 11/02/14 1858)   Assessment    The encounter diagnosis was Pruritus.  No visible rash right now. She has lots of excoriation from itching. We'll try treatment as outlined below and if no improvement will need to see a dermatologist.  Plan     1.  Meds:  The following meds were prescribed:   Discharge Medication List as of 11/02/2014  6:42 PM    START taking these medications   Details  doxepin (SINEQUAN) 10 MG capsule Take 1 capsule (10 mg total) by mouth 2 (two) times daily., Starting 11/02/2014, Until Discontinued, Normal    predniSONE (DELTASONE) 20 MG tablet Take 3 daily for 5 days, 2 daily for 5 days, 1 daily for 5 days., Normal    triamcinolone cream (KENALOG) 0.1 % Apply 1 application topically 3 (three) times daily., Starting 11/02/2014, Until Discontinued, Normal        2.  Patient Education/Counseling:  The patient was given appropriate handouts, self care instructions, and instructed in symptomatic relief.    3.  Follow up:  The patient was told to follow up here if no better in 3 to 4 days, or sooner if becoming worse in any way, and given some red flag symptoms such as worsening rash, fever, or difficulty breathing which would prompt immediate return.  Follow up here if necessary.      Shanon Brow  Collier Bullock, MD 11/02/14 [phone]

## 2014-11-02 NOTE — Discharge Instructions (Signed)
Pruritus  °Pruritus is an itch. There are many different problems that can cause an itch. Dry skin is one of the most common causes of itching. Most cases of itching do not require medical attention.  °HOME CARE INSTRUCTIONS  °Make sure your skin is moistened on a regular basis. A moisturizer that contains petroleum jelly is best for keeping moisture in your skin. If you develop a rash, you may try the following for relief:  °· Use corticosteroid cream. °· Apply cool compresses to the affected areas. °· Bathe with Epsom salts or baking soda in the bathwater. °· Soak in colloidal oatmeal baths. These are available at your pharmacy. °· Apply baking soda paste to the rash. Stir water into baking soda until it reaches a paste-like consistency. °· Use an anti-itch lotion. °· Take over-the-counter diphenhydramine medicine by mouth as the instructions direct. °· Avoid scratching. Scratching may cause the rash to become infected. If itching is very bad, your caregiver may suggest prescription lotions or creams to lessen your symptoms. °· Avoid hot showers, which can make itching worse. A cold shower may help with itching as long as you use a moisturizer after the shower. °SEEK MEDICAL CARE IF: °The itching does not go away after several days. °Document Released: 05/28/2011 Document Revised: 01/30/2014 Document Reviewed: 05/28/2011 °Exit[employer]® Patient Information ©2015 [employer]. This information is not intended to replace advice given to you by your health care provider. Make sure you discuss any questions you have with your health care provider. ° °

## 2014-11-02 NOTE — ED Notes (Addendum)
C/o itching all over arms, back, chest, axilla and behind her knees onset 1 week ago. Unable to sleep at night.  Seen at [facility] [facility] without relief 1/28. Rash initially from hair color, then got a rash with Doxycycline.

## 2015-02-11 IMAGING — CR DG CHEST 2V
2 series · 2 of 2 positions shown · non-contrast
Comparison: None.

CLINICAL DATA: Chest congestion, cough.

EXAM:
CHEST  2 VIEW

[w chest pa]
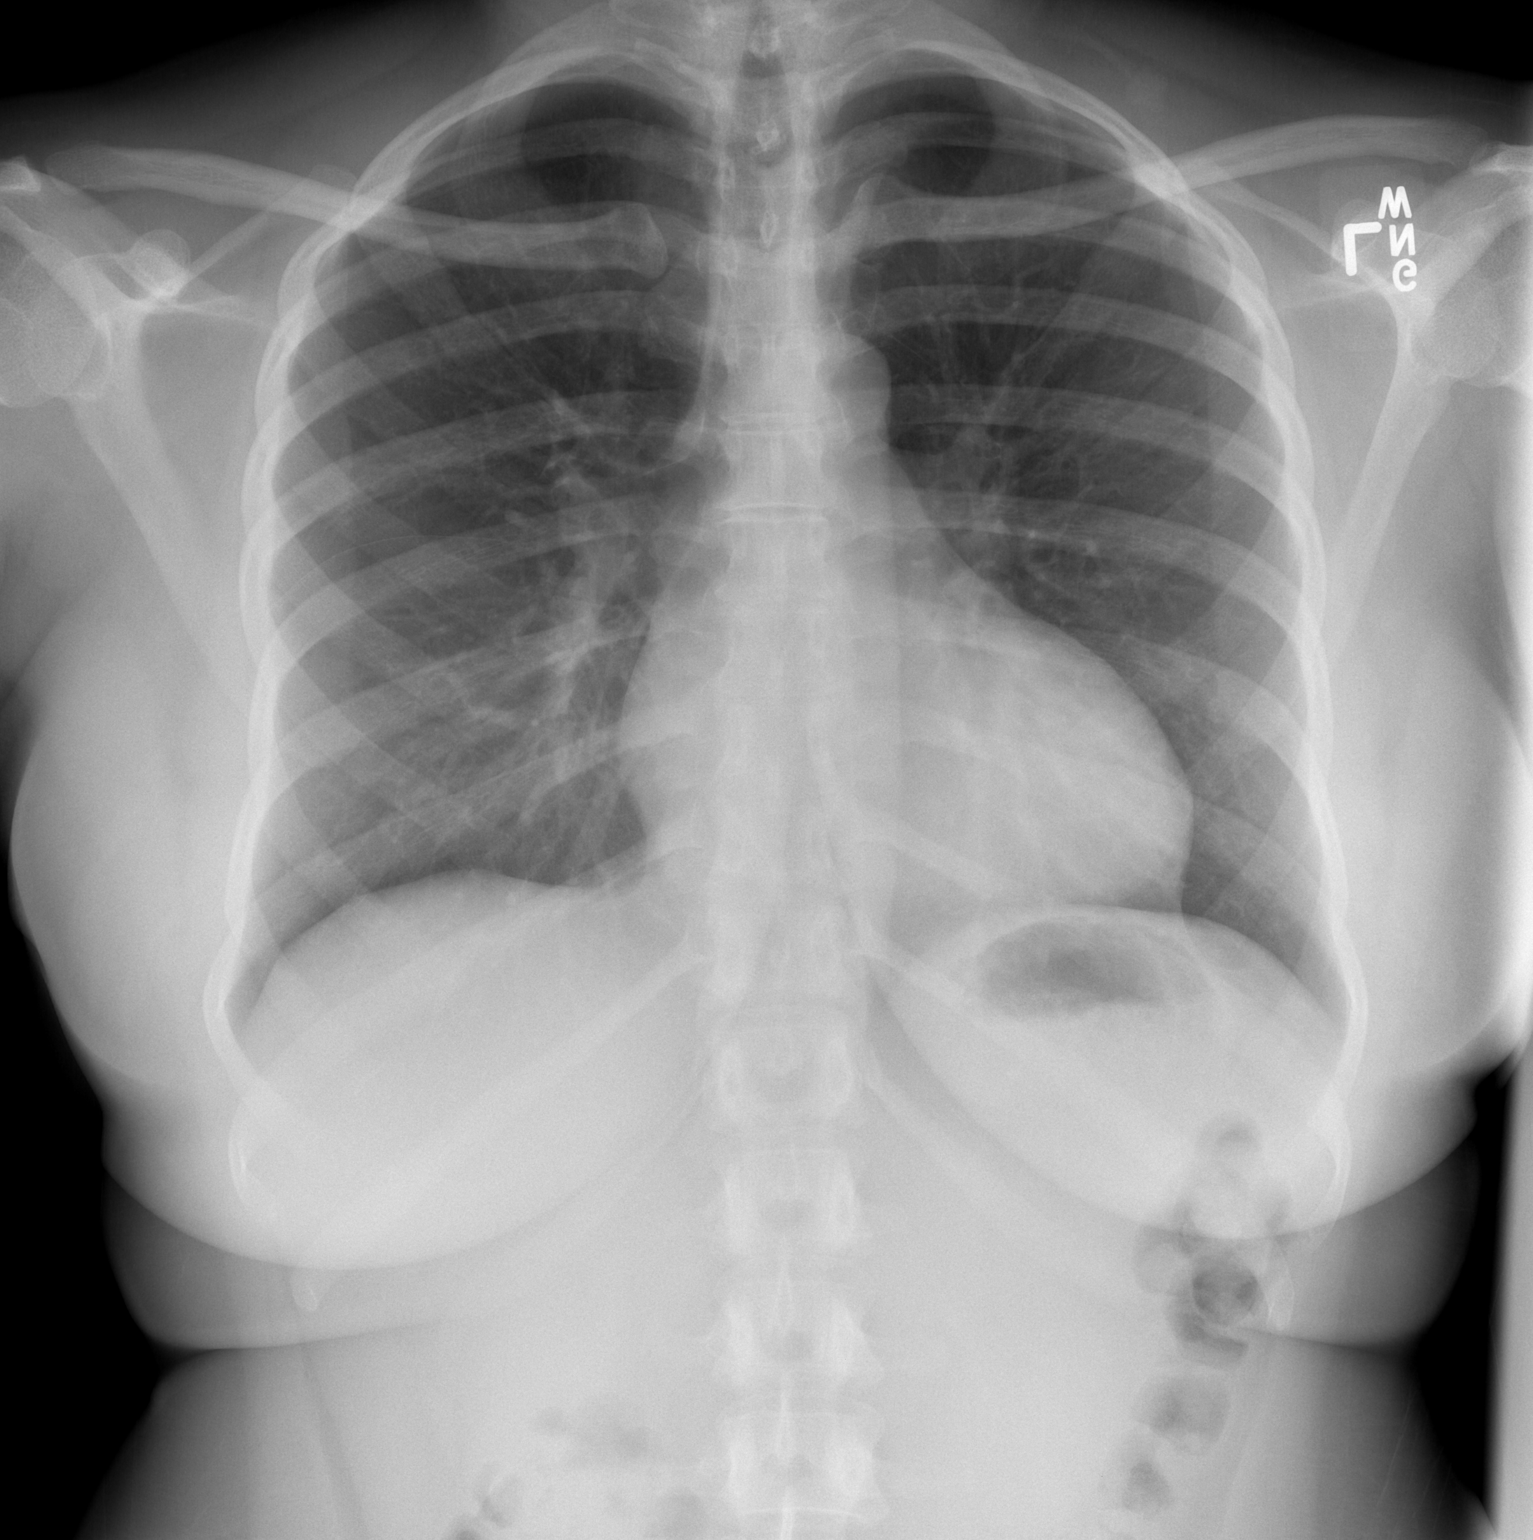

[w chest lat]
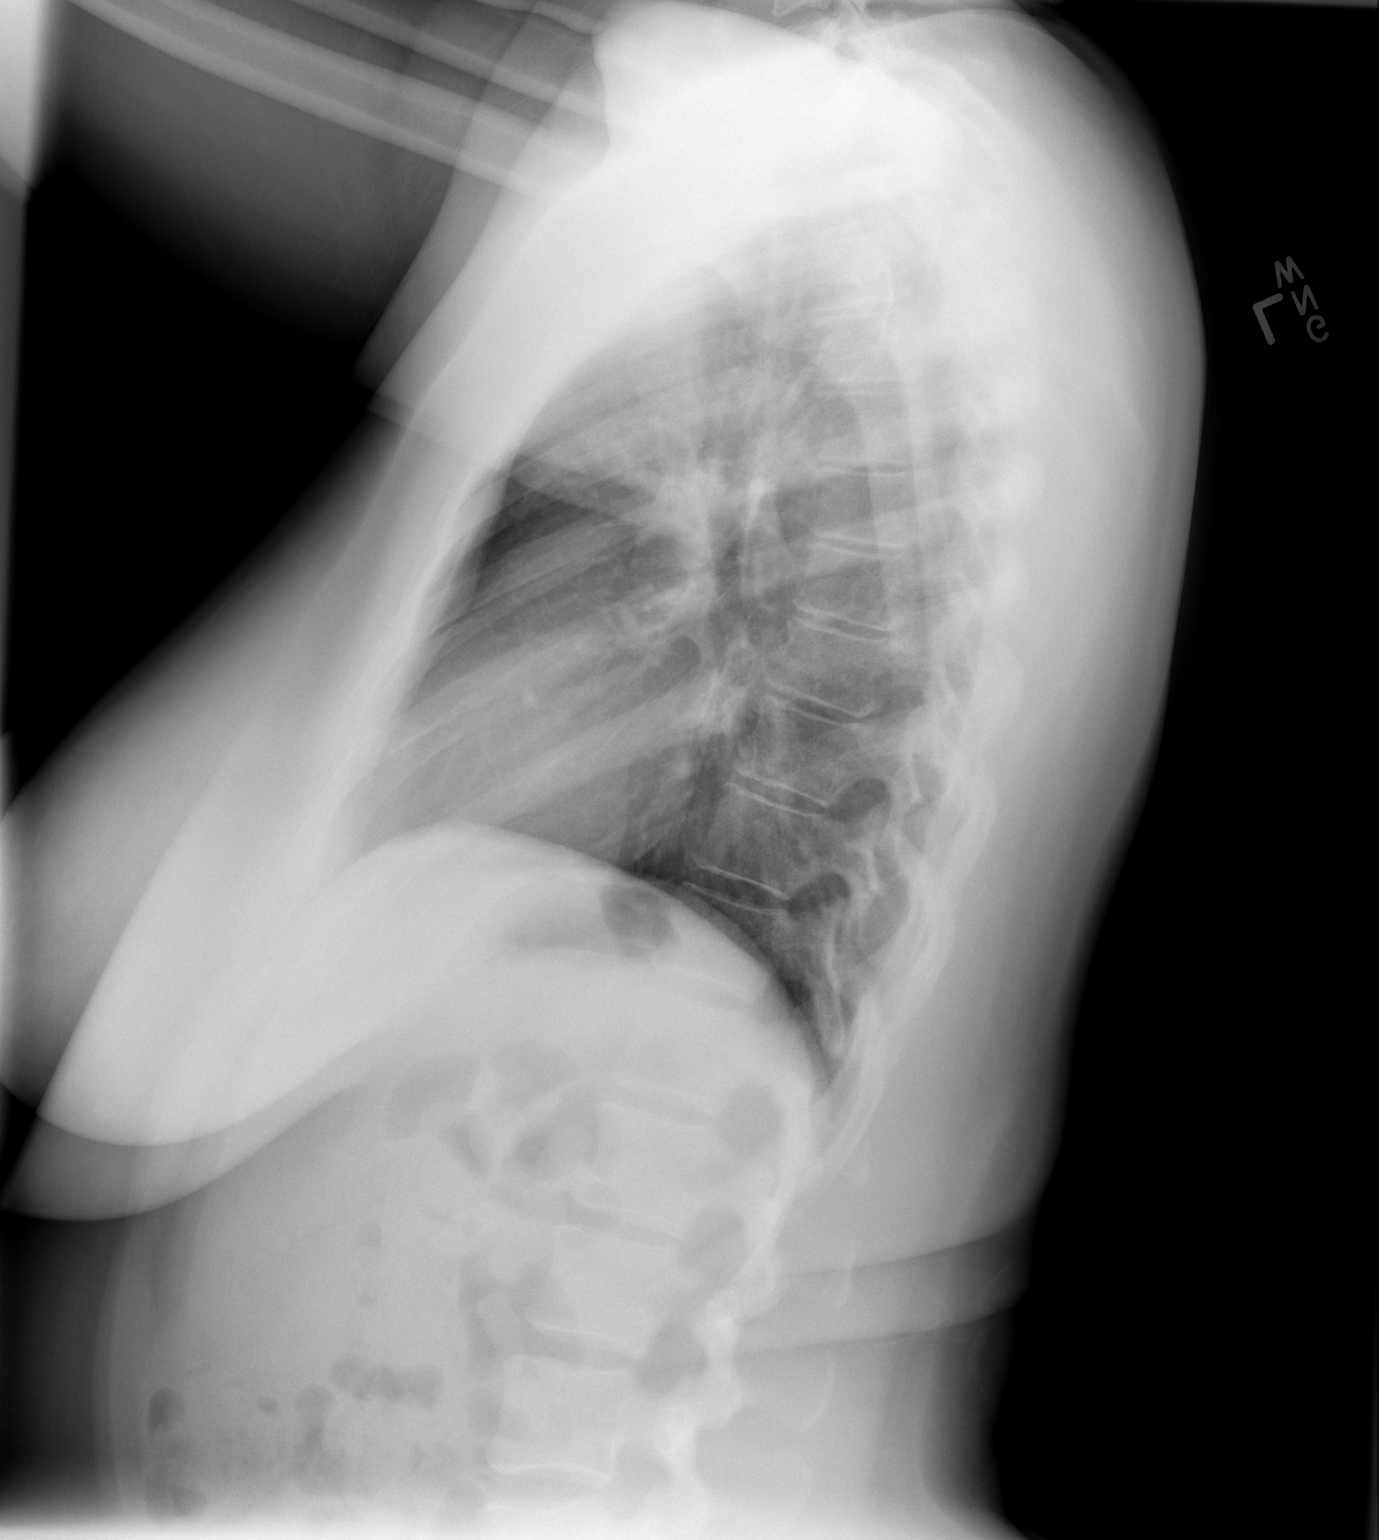

[2 of 2 positions shown; findings below may reference images not displayed]

FINDINGS: The cardiac and mediastinal silhouettes are within normal limits.

The lungs are normally inflated. No airspace consolidation, pleural
effusion, or pulmonary edema is identified. There is no
pneumothorax.

No acute osseous abnormality identified.
IMPRESSION: No active cardiopulmonary disease.

## 2017-01-10 DIAGNOSIS — Z79899 Other long term (current) drug therapy: Secondary | ICD-10-CM | POA: Insufficient documentation

## 2017-01-10 DIAGNOSIS — O0281 Inappropriate change in quantitative human chorionic gonadotropin (hCG) in early pregnancy: Secondary | ICD-10-CM | POA: Insufficient documentation

## 2017-01-10 DIAGNOSIS — Z3A01 Less than 8 weeks gestation of pregnancy: Secondary | ICD-10-CM | POA: Insufficient documentation

## 2017-01-11 ENCOUNTER — Emergency Department (HOSPITAL_COMMUNITY)
Admission: EM | Admit: 2017-01-11 | Discharge: 2017-01-11 | Disposition: A | Discharge status: Home / Self Care | Payer: 59 | Attending: Emergency Medicine | Admitting: Emergency Medicine

## 2017-01-11 ENCOUNTER — Encounter (HOSPITAL_COMMUNITY): Payer: Self-pay | Admitting: Emergency Medicine

## 2017-01-11 DIAGNOSIS — Z3A01 Less than 8 weeks gestation of pregnancy: Secondary | ICD-10-CM

## 2017-01-11 HISTORY — DX: Unspecified ectopic pregnancy without intrauterine pregnancy: O00.90

## 2017-01-11 LAB — URINALYSIS, ROUTINE W REFLEX MICROSCOPIC
BILIRUBIN URINE: NEGATIVE
Glucose, UA: NEGATIVE mg/dL
Hgb urine dipstick: NEGATIVE
Ketones, ur: NEGATIVE mg/dL
LEUKOCYTES UA: NEGATIVE
NITRITE: NEGATIVE
PH: 6 (ref 5.0–8.0)
Protein, ur: NEGATIVE mg/dL
Specific Gravity, Urine: 1.012 (ref 1.005–1.030)

## 2017-01-11 LAB — HCG, QUANTITATIVE, PREGNANCY: hCG, Beta Chain, Quant, S: 2056 m[IU]/mL — ABNORMAL HIGH (ref <5)

## 2017-01-11 LAB — POC URINE PREG, ED: PREG TEST UR: POSITIVE — AB

## 2017-01-11 NOTE — ED Provider Notes (Signed)
[facility] DEPT Provider Note   CSN: [id number] Arrival date & time: 01/10/17  2343     History   Chief Complaint Chief Complaint  Patient presents with  . Possible Pregnancy    HPI Darlene Berg is a 41 y.o. female.  Is a 41 year old female who had a home pregnancy test 2 days ago which was positive she is here because she is concerned she may have an ectopic pregnancy she has a history of ectopic pregnancy in 2014. Her last normal menstrual cycle was 34 days ago. Eyes any vaginal discharge or vaginal pain bleeding she states a week ago and 3 days ago she had epigastric pain that woke her from sleep at resolved with ibuprofen.       Past Medical History:  Diagnosis Date  . Ectopic pregnancy   . No pertinent past medical history     There are no active problems to display for this patient.   Past Surgical History:  Procedure Laterality Date  . NO PAST SURGERIES      OB History    Gravida Para Term Preterm AB Living   3 0 0 0 1 0   SAB TAB Ectopic Multiple Live Births   0 1 0 0         Home Medications    Prior to Admission medications   Medication Sig Start Date End Date Taking? Authorizing Provider  doxepin (SINEQUAN) 10 MG capsule Take 1 capsule (10 mg total) by mouth 2 (two) times daily. 11/02/14   Harden Mo, MD  hydrOXYzine (ATARAX/VISTARIL) 10 MG tablet Take 10 mg by mouth every 4 (four) hours as needed.    Historical Provider, MD  Multiple Vitamin (MULTIVITAMIN WITH MINERALS) TABS tablet Take 1 tablet by mouth daily.    Historical Provider, MD  Norgestimate-Ethinyl Estradiol Triphasic (ORTHO TRI-CYCLEN LO) 0.18/0.215/0.25 MG-25 MCG tab Take 1 tablet by mouth daily.    Historical Provider, MD  oxymetazoline (AFRIN) 0.05 % nasal spray Place 1 spray into both nostrils 2 (two) times daily as needed for congestion. 12/03/13   Junius Creamer, NP  prednisoLONE (ORAPRED) 15 MG/5ML solution Take by mouth daily before breakfast.    Historical Provider, MD    predniSONE (DELTASONE) 20 MG tablet Take 3 daily for 5 days, 2 daily for 5 days, 1 daily for 5 days. 11/02/14   Harden Mo, MD  triamcinolone cream (KENALOG) 0.1 % Apply 1 application topically 3 (three) times daily. 11/02/14   Harden Mo, MD    Family History Family History  Problem Relation Age of Onset  . Hypertension Mother   . Other Neg Hx     Social History Social History  Substance Use Topics  . Smoking status: Never Smoker  . Smokeless tobacco: Never Used  . Alcohol use 1.8 oz/week    3 Shots of liquor per week     Allergies   Doxycycline   Review of Systems Review of Systems  Genitourinary: Negative for dysuria, frequency, pelvic pain, vaginal bleeding, vaginal discharge and vaginal pain.     Physical Exam Updated Vital Signs BP 135/82 (BP Location: Right Arm)   Pulse 89   Temp 98.4 F (36.9 C) (Oral)   Resp 16   SpO2 99%   Physical Exam  Constitutional: She appears well-developed and well-nourished.  HENT:  Head: Normocephalic.  Eyes: Pupils are equal, round, and reactive to light.  Cardiovascular: Normal rate.   Abdominal: Bowel sounds are normal.  Neurological: She is alert.  Skin: Skin is warm.  Nursing note and vitals reviewed.    ED Treatments / Results  Labs (all labs ordered are listed, but only abnormal results are displayed) Labs Reviewed  URINALYSIS, ROUTINE W REFLEX MICROSCOPIC - Abnormal; Notable for the following:       Result Value   Color, Urine STRAW (*)    All other components within normal limits  POC URINE PREG, ED - Abnormal; Notable for the following:    Preg Test, Ur POSITIVE (*)    All other components within normal limits  HCG, QUANTITATIVE, PREGNANCY    EKG  EKG Interpretation None       Radiology No results found.  Procedures Procedures (including critical care time)  Medications Ordered in ED Medications - No data to display   Initial Impression / Assessment and Plan / ED Course  I have  reviewed the triage vital signs and the nursing notes.  Pertinent labs & imaging results that were available during my care of the patient were reviewed by me and considered in my medical decision making (see chart for details).    Discussed procedure with patient we will get a quantitative beta in the emergency department she will follow up with women's outpatient clinic on Monday she understands that her quantitative beta should double within 2 days if she develops any dominant cramping vaginal bleeding sharp stabbing pain dizziness tachycardia she is to go immediately to [facility] hospital for further evaluation.    Final Clinical Impressions(s) / ED Diagnoses   Final diagnoses:  None    New Prescriptions New Prescriptions   No medications on file     Junius Creamer, NP 01/11/17 [place], MD 01/11/17 [phone]

## 2017-01-11 NOTE — Discharge Instructions (Signed)
Today her pregnancy test is positive I understand her concern due to previous history of ectopic pregnancy is very important that she go to [facility] to the outpatient MAU unit she have a repeat quantitative beta drawn today your level is   2,056              if you pregnancy is progressing normally this should double within 2-3 days. If at anytime he developed vaginal bleeding excruciating sharp lower abdominal pain for rapid heart rate dizziness please return for further evaluation

## 2017-01-11 NOTE — ED Triage Notes (Signed)
Pt presents to ED after having a positive pregnancy test at home 2 days ago.  Pt states before she knew she was pregnant she was having some epigastric pain that was relieved by ibuprofen.  Patient now states that with the positive pregnancy test she is concerned due to a hx of ectopic pregnancy.  Denies any pain or n/v/d at this time.

## 2017-03-26 ENCOUNTER — Ambulatory Visit (INDEPENDENT_AMBULATORY_CARE_PROVIDER_SITE_OTHER): Payer: 59

## 2017-03-26 ENCOUNTER — Ambulatory Visit (HOSPITAL_COMMUNITY)
Admission: EM | Admit: 2017-03-26 | Discharge: 2017-03-26 | Disposition: A | Discharge status: Home / Self Care | Payer: 59 | Attending: Internal Medicine | Admitting: Internal Medicine

## 2017-03-26 ENCOUNTER — Encounter (HOSPITAL_COMMUNITY): Payer: Self-pay | Admitting: Emergency Medicine

## 2017-03-26 DIAGNOSIS — S6991XA Unspecified injury of right wrist, hand and finger(s), initial encounter: Secondary | ICD-10-CM

## 2017-03-26 DIAGNOSIS — S6010XA Contusion of unspecified finger with damage to nail, initial encounter: Secondary | ICD-10-CM

## 2017-03-26 DIAGNOSIS — M79644 Pain in right finger(s): Secondary | ICD-10-CM

## 2017-03-26 DIAGNOSIS — W231XXA Caught, crushed, jammed, or pinched between stationary objects, initial encounter: Secondary | ICD-10-CM

## 2017-03-26 DIAGNOSIS — S60111A Contusion of right thumb with damage to nail, initial encounter: Secondary | ICD-10-CM | POA: Diagnosis not present

## 2017-03-26 MED ORDER — IBUPROFEN 800 MG PO TABS: 800.0000 mg | ORAL_TABLET | Freq: Three times a day (TID) | ORAL | 0 refills | Status: AC

## 2017-03-26 NOTE — ED Triage Notes (Signed)
Pt sts she inj her right hand today around 0800 and inj her right thumb... sts she slammed car door on it.   Has been keeping ice on it w/temp relief  Pain increases w/activity  A&O x4... NAD... Ambulatory

## 2017-03-26 NOTE — ED Provider Notes (Signed)
CSN: [id number]     Arrival date & time 03/26/17  1714 History   None    Chief Complaint  Patient presents with  . Hand Injury   (Consider location/radiation/quality/duration/timing/severity/associated sxs/prior Treatment) 41 yo female comes in after slamming the door on her right thumb this morning. She has been icing the thumb with mild relief. Increase in pain and throbbing. Unable to bend thumb  at the DIP/PIP. Denies numbness and tingling. Has not taken anything for it.       Past Medical History:  Diagnosis Date  . Ectopic pregnancy   . No pertinent past medical history    Past Surgical History:  Procedure Laterality Date  . NO PAST SURGERIES     Family History  Problem Relation Age of Onset  . Hypertension Mother   . Other Neg Hx    Social History  Substance Use Topics  . Smoking status: Never Smoker  . Smokeless tobacco: Never Used  . Alcohol use 1.8 oz/week    3 Shots of liquor per week   OB History    Gravida Para Term Preterm AB Living   3 0 0 0 1 0   SAB TAB Ectopic Multiple Live Births   0 1 0 0       Review of Systems  Musculoskeletal: Positive for arthralgias, joint swelling and myalgias.  Skin: Negative for wound.  Neurological: Negative for numbness.    Allergies  Doxycycline  Home Medications   Prior to Admission medications   Medication Sig Start Date End Date Taking? Authorizing Provider  Multiple Vitamin (MULTIVITAMIN WITH MINERALS) TABS tablet Take 1 tablet by mouth daily.   Yes [provider]  Norgestimate-Ethinyl Estradiol Triphasic (ORTHO TRI-CYCLEN LO) 0.18/0.215/0.25 MG-25 MCG tab Take 1 tablet by mouth daily.   Yes [provider]  ibuprofen (ADVIL,MOTRIN) 800 MG tablet Take 1 tablet (800 mg total) by mouth 3 (three) times daily. 03/26/17   Ok Edwards, PA-C   Meds Ordered and Administered this Visit  Medications - No data to display  BP (!) 164/89 (BP Location: Left Arm) Comment: notified cma  Pulse 67   Temp  98.2 F (36.8 C) (Oral)   Resp 16   LMP 02/25/2017   SpO2 100%   Breastfeeding? No  No data found.   Physical Exam  Constitutional: She is oriented to person, place, and time. She appears well-developed and well-nourished. No distress.  HENT:  Head: Normocephalic and atraumatic.  Eyes: Conjunctivae are normal. Pupils are equal, round, and reactive to light.  Musculoskeletal:       Right wrist: Normal.       Left wrist: Normal.  Right hand: Swelling and ecchymosis seen on right thumb with subungual hematoma. Pain on palpation of the right thumb. No pain on palpation of the other fingers and palm. Unable to bend at DIP/PIP.   Neurological: She is alert and oriented to person, place, and time.  Skin: Skin is warm and dry.  Psychiatric: She has a normal mood and affect. Her behavior is normal. Judgment normal.    Urgent Care Course     Procedures (including critical care time)  Labs Review Labs Reviewed - No data to display  Imaging Review Dg Finger Thumb Right  Result Date: 03/26/2017 CLINICAL DATA:  Thumb slammed in car door EXAM: RIGHT THUMB 2+V COMPARISON:  None. FINDINGS: There is no evidence of fracture or dislocation. There is no evidence of arthropathy or other focal bone abnormality. Soft tissues are  unremarkable IMPRESSION: No fracture or dislocation of the right thumb. Electronically Signed   By: Ulyses Jarred M.D.   On: 03/26/2017 18:13         MDM   1. Injury of finger of right hand, initial encounter   2. Subungual hematoma of digit of hand, initial encounter    1. Reviewed imaging results with patient, no evidence of fracture or dislocation. Start Ibuprofen 800mg  TID x 10 days for pain and inflammation. Patient to continue to ice to reduce swelling. 2. Subungual hematoma drained today. Patient to keep area dry and clean.    Ok Edwards, PA-C 03/26/17 1830

## 2017-03-26 NOTE — Discharge Instructions (Signed)
Start ibuprofen 800mg  three times a day for 10 days for pain and inflammation. Continue to ice to reduce swelling.

## 2017-07-22 ENCOUNTER — Encounter (HOSPITAL_COMMUNITY): Payer: Self-pay | Admitting: Emergency Medicine

## 2017-07-22 ENCOUNTER — Ambulatory Visit (HOSPITAL_COMMUNITY)
Admission: EM | Admit: 2017-07-22 | Discharge: 2017-07-22 | Disposition: A | Discharge status: Home / Self Care | Payer: 59 | Attending: Family Medicine | Admitting: Family Medicine

## 2017-07-22 DIAGNOSIS — H6122 Impacted cerumen, left ear: Secondary | ICD-10-CM

## 2017-07-22 NOTE — ED Triage Notes (Signed)
PT reports left ear pain and fullness for 1 week. Denies congestion, sore throat.

## 2017-07-23 NOTE — ED Provider Notes (Signed)
  [facility]   [id number] 07/22/17 Arrival Time: 9381  ASSESSMENT & PLAN:  1. Impacted cerumen of left ear    Better after flushing. Observation. No infection. May f/u as needed.  Reviewed expectations re: course of current medical issues. Questions answered. Outlined signs and symptoms indicating need for more acute intervention. Patient verbalized understanding. After Visit Summary given.   SUBJECTIVE:  Darlene Berg is a 41 y.o. female who presents with complaint of L ear pressure and decreased hearing for a few days. No specific ear pain. "Ear feels clogged." No self treatment. No ear drainage.  ROS: As per HPI.   OBJECTIVE:  Vitals:   07/22/17 1741  BP: (!) 145/88  Pulse: 75  Resp: 16  Temp: 98.7 F (37.1 C)  TempSrc: Oral  SpO2: 100%  Weight: 145 lb (65.8 kg)  Height: 5\' 2"  (1.575 m)    General appearance: alert; no distress Eyes: PERRLA; EOMI; conjunctiva normal HENT: normocephalic; atraumatic; L EAC with cerumen impaction (TM normal after flushing ear) Neck: supple Lungs: clear to auscultation bilaterally Skin: warm and dry Psychological: alert and cooperative; normal mood and affect   Allergies  Allergen Reactions  . Doxycycline Rash    Past Medical History:  Diagnosis Date  . Ectopic pregnancy   . No pertinent past medical history    Social History   Social History  . Marital status: Married    Spouse name: N/A  . Number of children: N/A  . Years of education: N/A   Occupational History  . Not on file.   Social History Main Topics  . Smoking status: Never Smoker  . Smokeless tobacco: Never Used  . Alcohol use 1.8 oz/week    3 Shots of liquor per week  . Drug use: No  . Sexual activity: Yes    Birth control/ protection: Pill   Other Topics Concern  . Not on file   Social History Narrative  . No narrative on file   Family History  Problem Relation Age of Onset  . Hypertension Mother   . Other Neg Hx     Past Surgical History:  Procedure Laterality Date  . NO PAST SURGERIES       Vanessa Kick, MD 07/23/17 [phone]

## 2017-07-24 ENCOUNTER — Encounter (HOSPITAL_COMMUNITY): Payer: Self-pay | Admitting: Emergency Medicine

## 2017-07-24 ENCOUNTER — Ambulatory Visit (HOSPITAL_COMMUNITY)
Admission: EM | Admit: 2017-07-24 | Discharge: 2017-07-24 | Disposition: A | Discharge status: Home / Self Care | Payer: 59 | Attending: Emergency Medicine | Admitting: Emergency Medicine

## 2017-07-24 DIAGNOSIS — H6122 Impacted cerumen, left ear: Secondary | ICD-10-CM

## 2017-07-24 DIAGNOSIS — H6123 Impacted cerumen, bilateral: Secondary | ICD-10-CM | POA: Diagnosis not present

## 2017-07-24 NOTE — ED Provider Notes (Signed)
Darlene Berg    CSN: [id number] Arrival date & time: 07/24/17  1549     History   Chief Complaint Chief Complaint  Patient presents with  . Otalgia    HPI Darlene Berg is a 41 y.o. female.   Darlene Berg presents with complaints of left ear fullness sensation which has been ongoing for the past few days. Was seen and had left ear flushed on 10/24 with some cerumen removal. Still some remained despite flushing, therefore patient has been using debrox at home, with still no improvement. Denies ear pain. States feels that she cannot hear to from the left ear. Denies pain or hearing loss to right ear. Without URI symptoms. Has not had previous similar in the past.       Past Medical History:  Diagnosis Date  . Ectopic pregnancy   . No pertinent past medical history     There are no active problems to display for this patient.   Past Surgical History:  Procedure Laterality Date  . NO PAST SURGERIES      OB History    Gravida Para Term Preterm AB Living   3 0 0 0 1 0   SAB TAB Ectopic Multiple Live Births   0 1 0 0         Home Medications    Prior to Admission medications   Medication Sig Start Date End Date Taking? Authorizing Provider  ibuprofen (ADVIL,MOTRIN) 800 MG tablet Take 1 tablet (800 mg total) by mouth 3 (three) times daily. 03/26/17   Ok Edwards, PA-C  Multiple Vitamin (MULTIVITAMIN WITH MINERALS) TABS tablet Take 1 tablet by mouth daily.    [provider]  Norgestimate-Ethinyl Estradiol Triphasic (ORTHO TRI-CYCLEN LO) 0.18/0.215/0.25 MG-25 MCG tab Take 1 tablet by mouth daily.    [provider]    Family History Family History  Problem Relation Age of Onset  . Hypertension Mother   . Other Neg Hx     Social History Social History  Substance Use Topics  . Smoking status: Never Smoker  . Smokeless tobacco: Never Used  . Alcohol use 1.8 oz/week    3 Shots of liquor per week     Allergies    Doxycycline   Review of Systems Review of Systems   Physical Exam Triage Vital Signs ED Triage Vitals [07/24/17 1603]  Enc Vitals Group     BP 123/80     Pulse Rate 94     Resp 18     Temp 98.3 F (36.8 C)     Temp Source Oral     SpO2 98 %     Weight      Height      Head Circumference      Peak Flow      Pain Score 4     Pain Loc      Pain Edu?      Excl. in [place]?    No data found.   Updated Vital Signs BP 123/80 (BP Location: Right Arm)   Pulse 94   Temp 98.3 F (36.8 C) (Oral)   Resp 18   SpO2 98%   Visual Acuity Right Eye Distance:   Left Eye Distance:   Bilateral Distance:    Right Eye Near:   Left Eye Near:    Bilateral Near:     Physical Exam  Constitutional: She appears well-developed and well-nourished. No distress.  HENT:  Head: Normocephalic and atraumatic.  Right Ear: External ear  and ear canal normal.  Left Ear: External ear and ear canal normal.  Nose: Nose normal.  Mouth/Throat: Oropharynx is clear and moist.  Bilateral ears with significant cerumen; unable to visualize either TM on initial exam  Cardiovascular: Normal rate and regular rhythm.   Pulmonary/Chest: Effort normal.  Skin: Skin is warm and dry.  Vitals reviewed.    UC Treatments / Results  Labs (all labs ordered are listed, but only abnormal results are displayed) Labs Reviewed - No data to display  EKG  EKG Interpretation None       Radiology No results found.  Procedures Procedures (including critical care time)  Medications Ordered in UC Medications - No data to display   Initial Impression / Assessment and Plan / UC Course  I have reviewed the triage vital signs and the nursing notes.  Pertinent labs & imaging results that were available during my care of the patient were reviewed by me and considered in my medical decision making (see chart for details).     Ear canals and TM's bilaterally WNL s/p irrigation. Patient states her symptoms have  resolved, hearing is normal for her, and without any ear discomfort. Education provided to prevent impaction. Use of debrox as needed for excess cerumen. Patient verbalized understanding and agreeable to plan.   Zigmund Gottron, NP 07/24/2017 4:42 PM   Final Clinical Impressions(s) / UC Diagnoses   Final diagnoses:  Impacted cerumen of left ear    New Prescriptions New Prescriptions   No medications on file     Controlled Substance Prescriptions [place] Controlled Substance Registry consulted? Not Applicable   Zigmund Gottron, NP 07/24/17 1643

## 2017-07-24 NOTE — ED Triage Notes (Signed)
Pt sts left ear pain and fullness; pt sts not feeling better

## 2018-06-04 IMAGING — DX DG FINGER THUMB 2+V*R*
3 series · 3 of 3 positions shown · non-contrast
Comparison: None.

CLINICAL DATA: Thumb slammed in car door

EXAM:
RIGHT THUMB 2+V

[finger ap]
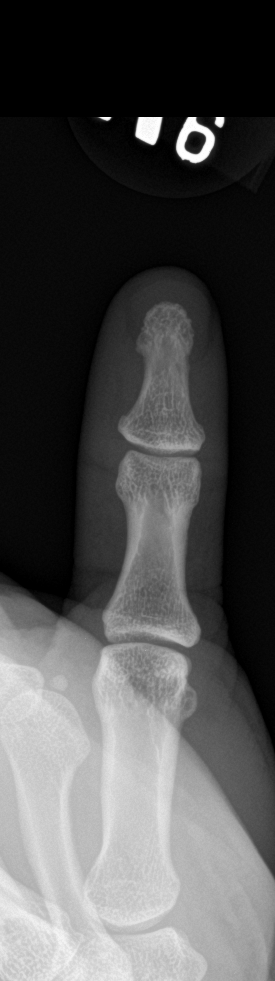

[finger obl]
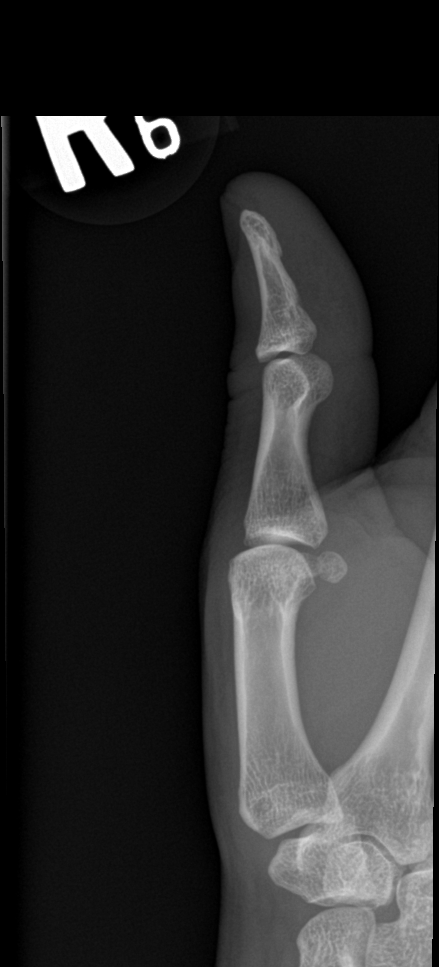

[finger lat]
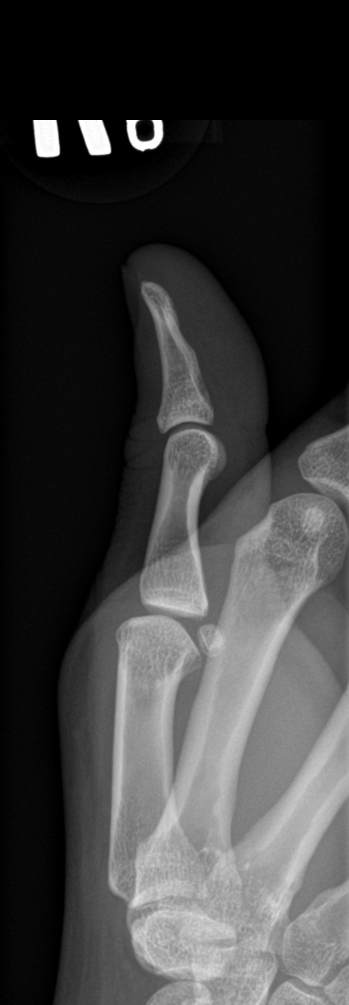

[3 of 3 positions shown; findings below may reference images not displayed]

FINDINGS: There is no evidence of fracture or dislocation. There is no
evidence of arthropathy or other focal bone abnormality. Soft
tissues are unremarkable
IMPRESSION: No fracture or dislocation of the right thumb.

## 2019-07-05 ENCOUNTER — Other Ambulatory Visit: Payer: Self-pay | Admitting: Obstetrics and Gynecology

## 2019-07-18 ENCOUNTER — Encounter (HOSPITAL_COMMUNITY): Admission: RE | Payer: Self-pay | Source: Home / Self Care

## 2019-07-18 ENCOUNTER — Inpatient Hospital Stay (HOSPITAL_COMMUNITY): Admission: RE | Admit: 2019-07-18 | Payer: 59 | Source: Home / Self Care | Admitting: Obstetrics and Gynecology

## 2019-07-18 SURGERY — MYOMECTOMY, ABDOMINAL APPROACH
Anesthesia: General

## 2020-12-11 ENCOUNTER — Other Ambulatory Visit: Payer: Self-pay | Admitting: Obstetrics and Gynecology

## 2020-12-18 ENCOUNTER — Other Ambulatory Visit (HOSPITAL_COMMUNITY): Payer: 59

## 2020-12-25 ENCOUNTER — Other Ambulatory Visit: Payer: Self-pay

## 2020-12-25 ENCOUNTER — Encounter (HOSPITAL_BASED_OUTPATIENT_CLINIC_OR_DEPARTMENT_OTHER): Payer: Self-pay | Admitting: Obstetrics and Gynecology

## 2020-12-25 ENCOUNTER — Other Ambulatory Visit (HOSPITAL_COMMUNITY)
Admission: RE | Admit: 2020-12-25 | Discharge: 2020-12-25 | Disposition: A | Discharge status: Home / Self Care | Payer: 59 | Source: Ambulatory Visit | Attending: Obstetrics and Gynecology | Admitting: Obstetrics and Gynecology

## 2020-12-25 DIAGNOSIS — Z01812 Encounter for preprocedural laboratory examination: Secondary | ICD-10-CM | POA: Insufficient documentation

## 2020-12-25 DIAGNOSIS — Z20822 Contact with and (suspected) exposure to covid-19: Secondary | ICD-10-CM | POA: Diagnosis not present

## 2020-12-25 LAB — SARS CORONAVIRUS 2 (TAT 6-24 HRS): SARS Coronavirus 2: NEGATIVE

## 2020-12-25 NOTE — Progress Notes (Signed)
Spoke w/ via phone for pre-op interview---pt Lab needs dos----cbc t & s urine preg               Lab results------none COVID test ------12-25-2020 1455 Arrive at -------830 am 12-28-2020 NPO after MN NO Solid Food.  Clear liquids from MN until---730 am then npo Med rec completed Medications to take morning of surgery -----none Diabetic medication -----n/a Patient instructed to bring photo id and insurance card day of surgery Patient aware to have Driver (ride ) / caregiver  Mother or boyfriend   for 24 hours after surgery  Patient Special Instructions -----none Pre-Op special Istructions -----none Patient verbalized understanding of instructions that were given at this phone interview. Patient denies shortness of breath, chest pain, fever, cough at this phone interview.

## 2020-12-28 ENCOUNTER — Encounter (HOSPITAL_BASED_OUTPATIENT_CLINIC_OR_DEPARTMENT_OTHER): Payer: Self-pay | Admitting: Obstetrics and Gynecology

## 2020-12-28 ENCOUNTER — Ambulatory Visit (HOSPITAL_BASED_OUTPATIENT_CLINIC_OR_DEPARTMENT_OTHER): Payer: 59 | Admitting: Anesthesiology

## 2020-12-28 ENCOUNTER — Other Ambulatory Visit: Payer: Self-pay

## 2020-12-28 ENCOUNTER — Ambulatory Visit (HOSPITAL_BASED_OUTPATIENT_CLINIC_OR_DEPARTMENT_OTHER)
Admission: RE | Admit: 2020-12-28 | Discharge: 2020-12-28 | Disposition: A | Discharge status: Home / Self Care | Payer: 59 | Attending: Obstetrics and Gynecology | Admitting: Obstetrics and Gynecology

## 2020-12-28 ENCOUNTER — Encounter (HOSPITAL_BASED_OUTPATIENT_CLINIC_OR_DEPARTMENT_OTHER)
Admission: RE | Disposition: A | Discharge status: Home / Self Care | Payer: Self-pay | Source: Home / Self Care | Attending: Obstetrics and Gynecology

## 2020-12-28 DIAGNOSIS — N920 Excessive and frequent menstruation with regular cycle: Secondary | ICD-10-CM | POA: Insufficient documentation

## 2020-12-28 DIAGNOSIS — Z8249 Family history of ischemic heart disease and other diseases of the circulatory system: Secondary | ICD-10-CM | POA: Diagnosis not present

## 2020-12-28 DIAGNOSIS — Z881 Allergy status to other antibiotic agents status: Secondary | ICD-10-CM | POA: Insufficient documentation

## 2020-12-28 DIAGNOSIS — Z791 Long term (current) use of non-steroidal anti-inflammatories (NSAID): Secondary | ICD-10-CM | POA: Insufficient documentation

## 2020-12-28 DIAGNOSIS — D259 Leiomyoma of uterus, unspecified: Secondary | ICD-10-CM | POA: Diagnosis present

## 2020-12-28 DIAGNOSIS — Z79899 Other long term (current) drug therapy: Secondary | ICD-10-CM | POA: Diagnosis not present

## 2020-12-28 DIAGNOSIS — D250 Submucous leiomyoma of uterus: Secondary | ICD-10-CM | POA: Diagnosis not present

## 2020-12-28 HISTORY — PX: DILATATION & CURETTAGE/HYSTEROSCOPY WITH MYOSURE: SHX6511

## 2020-12-28 HISTORY — DX: Leiomyoma of uterus, unspecified: D25.9

## 2020-12-28 HISTORY — DX: Presence of spectacles and contact lenses: Z97.3

## 2020-12-28 HISTORY — DX: Cardiac murmur, unspecified: R01.1

## 2020-12-28 LAB — POCT I-STAT, CHEM 8
BUN: 10 mg/dL (ref 6–20)
Calcium, Ion: 1.24 mmol/L (ref 1.15–1.40)
Chloride: 104 mmol/L (ref 98–111)
Creatinine, Ser: 0.6 mg/dL (ref 0.44–1.00)
Glucose, Bld: 108 mg/dL — ABNORMAL HIGH (ref 70–99)
HCT: 36 % (ref 36.0–46.0)
Hemoglobin: 12.2 g/dL (ref 12.0–15.0)
Potassium: 3.8 mmol/L (ref 3.5–5.1)
Sodium: 140 mmol/L (ref 135–145)
TCO2: 25 mmol/L (ref 22–32)

## 2020-12-28 LAB — POCT PREGNANCY, URINE: Preg Test, Ur: NEGATIVE

## 2020-12-28 SURGERY — RADIOFREQUENCY ABLATION, LEIOMYOMA, UTERUS, TRANSCERVICAL APPROACH, WITH US GUIDANCE
Anesthesia: General

## 2020-12-28 MED ORDER — MIDAZOLAM HCL 2 MG/2ML IJ SOLN
INTRAMUSCULAR | Status: AC
  Filled 2020-12-28: qty 2

## 2020-12-28 MED ORDER — PROMETHAZINE HCL 25 MG/ML IJ SOLN: 6.2500 mg | INTRAMUSCULAR | Status: DC | PRN

## 2020-12-28 MED ORDER — LIDOCAINE 2% (20 MG/ML) 5 ML SYRINGE
INTRAMUSCULAR | Status: DC | PRN
  Administered 2020-12-28: 60 mg via INTRAVENOUS

## 2020-12-28 MED ORDER — ACETAMINOPHEN 10 MG/ML IV SOLN
INTRAVENOUS | Status: AC
  Filled 2020-12-28: qty 100

## 2020-12-28 MED ORDER — DEXAMETHASONE SODIUM PHOSPHATE 10 MG/ML IJ SOLN
INTRAMUSCULAR | Status: AC
  Filled 2020-12-28: qty 1

## 2020-12-28 MED ORDER — ACETAMINOPHEN 10 MG/ML IV SOLN
1000.0000 mg | Freq: Once | INTRAVENOUS | Status: DC | PRN
  Administered 2020-12-28: 1000 mg via INTRAVENOUS

## 2020-12-28 MED ORDER — ONDANSETRON HCL 4 MG/2ML IJ SOLN
INTRAMUSCULAR | Status: DC | PRN
  Administered 2020-12-28: 4 mg via INTRAVENOUS

## 2020-12-28 MED ORDER — FENTANYL CITRATE (PF) 100 MCG/2ML IJ SOLN
INTRAMUSCULAR | Status: DC | PRN
  Administered 2020-12-28 (×5): 25 ug via INTRAVENOUS
  Administered 2020-12-28: 50 ug via INTRAVENOUS
  Administered 2020-12-28: 25 ug via INTRAVENOUS

## 2020-12-28 MED ORDER — POVIDONE-IODINE 10 % EX SWAB: 2.0000 "application " | Freq: Once | CUTANEOUS | Status: DC

## 2020-12-28 MED ORDER — FENTANYL CITRATE (PF) 100 MCG/2ML IJ SOLN
25.0000 ug | INTRAMUSCULAR | Status: DC | PRN
  Administered 2020-12-28: 25 ug via INTRAVENOUS

## 2020-12-28 MED ORDER — HYDROCODONE-ACETAMINOPHEN 5-325 MG PO TABS: 1.0000 | ORAL_TABLET | Freq: Four times a day (QID) | ORAL | 0 refills | Status: AC | PRN

## 2020-12-28 MED ORDER — DEXAMETHASONE SODIUM PHOSPHATE 10 MG/ML IJ SOLN
INTRAMUSCULAR | Status: DC | PRN
  Administered 2020-12-28: 10 mg via INTRAVENOUS

## 2020-12-28 MED ORDER — PROPOFOL 10 MG/ML IV BOLUS
INTRAVENOUS | Status: DC | PRN
  Administered 2020-12-28: 180 mg via INTRAVENOUS

## 2020-12-28 MED ORDER — LIDOCAINE 2% (20 MG/ML) 5 ML SYRINGE
INTRAMUSCULAR | Status: AC
  Filled 2020-12-28: qty 5

## 2020-12-28 MED ORDER — KETOROLAC TROMETHAMINE 30 MG/ML IJ SOLN
INTRAMUSCULAR | Status: AC
  Filled 2020-12-28: qty 1

## 2020-12-28 MED ORDER — SODIUM CHLORIDE 0.9 % IR SOLN
Status: DC | PRN
  Administered 2020-12-28: 1

## 2020-12-28 MED ORDER — KETOROLAC TROMETHAMINE 30 MG/ML IJ SOLN
INTRAMUSCULAR | Status: DC | PRN
  Administered 2020-12-28: 30 mg via INTRAVENOUS

## 2020-12-28 MED ORDER — FENTANYL CITRATE (PF) 100 MCG/2ML IJ SOLN
INTRAMUSCULAR | Status: AC
  Filled 2020-12-28: qty 2

## 2020-12-28 MED ORDER — ONDANSETRON HCL 4 MG/2ML IJ SOLN
INTRAMUSCULAR | Status: AC
  Filled 2020-12-28: qty 2

## 2020-12-28 MED ORDER — MIDAZOLAM HCL 5 MG/5ML IJ SOLN
INTRAMUSCULAR | Status: DC | PRN
  Administered 2020-12-28: 2 mg via INTRAVENOUS

## 2020-12-28 MED ORDER — LACTATED RINGERS IV SOLN
INTRAVENOUS | Status: DC
  Administered 2020-12-28: 1000 mL via INTRAVENOUS

## 2020-12-28 MED ORDER — PROPOFOL 10 MG/ML IV BOLUS
INTRAVENOUS | Status: AC
  Filled 2020-12-28: qty 20

## 2020-12-28 SURGICAL SUPPLY — 28 items
CANISTER SUCT 3000ML PPV (MISCELLANEOUS) ×2 IMPLANT
CATH ROBINSON RED A/P 14FR (CATHETERS) ×2 IMPLANT
CATH ROBINSON RED A/P 16FR (CATHETERS) ×2 IMPLANT
COVER WAND RF STERILE (DRAPES) ×2 IMPLANT
DEVICE MYOSURE LITE (MISCELLANEOUS) IMPLANT
DEVICE MYOSURE REACH (MISCELLANEOUS) ×2 IMPLANT
DILATOR CANAL MILEX (MISCELLANEOUS) ×2 IMPLANT
ELECT DISPERSIVE SONATA (MISCELLANEOUS) ×2 IMPLANT
ELECT REM PT RETURN 9FT ADLT (ELECTROSURGICAL) ×4
ELECTRODE REM PT RTRN 9FT ADLT (ELECTROSURGICAL) ×2 IMPLANT
GAUZE 4X4 16PLY RFD (DISPOSABLE) ×2 IMPLANT
GLOVE SURG LTX SZ6.5 (GLOVE) ×2 IMPLANT
GLOVE SURG UNDER POLY LF SZ7 (GLOVE) ×2 IMPLANT
GOWN STRL REUS W/TWL LRG LVL3 (GOWN DISPOSABLE) ×2 IMPLANT
HANDPIECE RFA SONATA (MISCELLANEOUS) ×2 IMPLANT
IV NS IRRIG 3000ML ARTHROMATIC (IV SOLUTION) ×4 IMPLANT
KIT PROCEDURE FLUENT (KITS) ×2 IMPLANT
KIT TURNOVER CYSTO (KITS) ×2 IMPLANT
MYOSURE XL FIBROID (MISCELLANEOUS)
PACK VAGINAL MINOR WOMEN LF (CUSTOM PROCEDURE TRAY) ×2 IMPLANT
PAD OB MATERNITY 4.3X12.25 (PERSONAL CARE ITEMS) ×2 IMPLANT
PAD PREP 24X48 CUFFED NSTRL (MISCELLANEOUS) ×2 IMPLANT
SEAL CERVICAL OMNI LOK (ABLATOR) IMPLANT
SEAL ROD LENS SCOPE MYOSURE (ABLATOR) ×2 IMPLANT
SYR 50ML LL SCALE MARK (SYRINGE) ×2 IMPLANT
SYSTEM TISS REMOVAL MYOSURE XL (MISCELLANEOUS) IMPLANT
TOWEL OR 17X26 10 PK STRL BLUE (TOWEL DISPOSABLE) ×2 IMPLANT
WATER STERILE IRR 500ML POUR (IV SOLUTION) ×2 IMPLANT

## 2020-12-28 NOTE — Transfer of Care (Signed)
Immediate Anesthesia Transfer of Care Note  Patient: Darlene Berg  Procedure(s) Performed: Radio Frequency Ablation with Sonata (N/A ) DILATATION & CURETTAGE/HYSTEROSCOPY WITH MYOSURE (N/A )  Patient Location: PACU  Anesthesia Type:General  Level of Consciousness: awake, alert  and oriented  Airway & Oxygen Therapy: Patient Spontanous Breathing and Patient connected to nasal cannula oxygen  Post-op Assessment: Report given to RN and Post -op Vital signs reviewed and stable  Post vital signs: Reviewed and stable  Last Vitals:  Vitals Value Taken Time  BP    Temp    Pulse 87 12/28/20 1404  Resp 21 12/28/20 1404  SpO2 100 % 12/28/20 1404  Vitals shown include unvalidated device data.  Last Pain:  Vitals:   12/28/20 0917  TempSrc: Oral  PainSc: 0-No pain      Patients Stated Pain Goal: 4 (62/[id number])  Complications: No complications documented.

## 2020-12-28 NOTE — Anesthesia Postprocedure Evaluation (Signed)
Anesthesia Post Note  Patient: Darlene Berg  Procedure(s) Performed: Radio Frequency Ablation with Sonata (N/A ) DILATATION & CURETTAGE/HYSTEROSCOPY WITH MYOSURE (N/A )     Patient location during evaluation: PACU Anesthesia Type: General Level of consciousness: awake and alert Pain management: pain level controlled Vital Signs Assessment: post-procedure vital signs reviewed and stable Respiratory status: spontaneous breathing, nonlabored ventilation, respiratory function stable and patient connected to nasal cannula oxygen Cardiovascular status: blood pressure returned to baseline and stable Postop Assessment: no apparent nausea or vomiting Anesthetic complications: no   No complications documented.  Last Vitals:  Vitals:   12/28/20 1430 12/28/20 1445  BP: (!) 159/94 (!) 151/83  Pulse: 80 77  Resp: 18 19  Temp:  36.8 C  SpO2: 99% 99%    Last Pain:  Vitals:   12/28/20 1445  TempSrc:   PainSc: 4                  Leiana Rund P Kathrynn Backstrom

## 2020-12-28 NOTE — Anesthesia Procedure Notes (Deleted)
Performed by: Shavonn Convey, Jinger Neighbors, CRNA

## 2020-12-28 NOTE — H&P (Signed)
Darlene Berg is an 45 y.o. female.G1P0010 SBF here for surgical mgmt of symptomatic uterine fibroids using Sonata. Pt desires to preserve fertility  Pertinent Gynecological History: Menses: flow is excessive with use of 6 pads or tampons on heaviest days Bleeding: heavy Contraception: oral progesterone-only contraceptive DES exposure: denies Blood transfusions: none Sexually transmitted diseases: no past history Previous GYN Procedures: none Last mammogram: normal Date: 2022 Last pap: normal Date: 2022 OB History: G1P0010   Menstrual History: Menarche age: n/a Patient's last menstrual period was 12/04/2020.    Past Medical History:  Diagnosis Date  . Ectopic pregnancy 2014   treated medically  . Heart murmur    "mild since birth no cardiologist" per pt  . Uterine fibroid   . Wears glasses     Past Surgical History:  Procedure Laterality Date  . NO PAST SURGERIES      Family History  Problem Relation Age of Onset  . Hypertension Mother   . Other Neg Hx     Social History:  reports that she has never smoked. She has never used smokeless tobacco. She reports current alcohol use of about 3.0 standard drinks of alcohol per week. She reports that she does not use drugs.  Allergies:  Allergies  Allergen Reactions  . Doxycycline Rash    Medications Prior to Admission  Medication Sig Dispense Refill Last Dose  . NORETHINDRONE PO Take by mouth. Takes am or pm it varies   12/27/2020 at Unknown time  . ibuprofen (ADVIL,MOTRIN) 800 MG tablet Take 1 tablet (800 mg total) by mouth 3 (three) times daily. (Patient taking differently: Take 800 mg by mouth every 6 (six) hours as needed. Only takes with menustral cycle) 30 tablet 0 12/04/2020  . Multiple Vitamin (MULTIVITAMIN WITH MINERALS) TABS tablet Take 1 tablet by mouth daily. Equate mvi   12/24/2020    Review of Systems  All other systems reviewed and are negative.   Blood pressure 134/82, pulse 82, temperature 99.7 F  (37.6 C), temperature source Oral, resp. rate (!) 24, height 5\' 1"  (1.549 m), weight 76.3 kg, last menstrual period 12/04/2020, SpO2 100 %. Physical Exam Constitutional:      Appearance: Normal appearance.  HENT:     Head: Atraumatic.  Eyes:     Extraocular Movements: Extraocular movements intact.  Cardiovascular:     Rate and Rhythm: Regular rhythm.     Heart sounds: Normal heart sounds.  Pulmonary:     Breath sounds: Normal breath sounds.  Abdominal:     Tenderness: There is no abdominal tenderness.     Hernia: No hernia is present.  Genitourinary:    General: Normal vulva.     Comments: Vagina nl  Cervix closed Uterus 14 wk size irreg And non palp Musculoskeletal:     Cervical back: Neck supple.  Skin:    General: Skin is warm and dry.  Neurological:     General: No focal deficit present.     Mental Status: She is alert and oriented to person, place, and time.  Psychiatric:        Mood and Affect: Mood normal.        Behavior: Behavior normal.     Results for orders placed or performed during the hospital encounter of 12/28/20 (from the past 24 hour(s))  Pregnancy, urine POC     Status: None   Collection Time: 12/28/20  9:04 AM  Result Value Ref Range   Preg Test, Ur NEGATIVE NEGATIVE  I-STAT, chem 8  Status: Abnormal   Collection Time: 12/28/20  9:38 AM  Result Value Ref Range   Sodium 140 135 - 145 mmol/L   Potassium 3.8 3.5 - 5.1 mmol/L   Chloride 104 98 - 111 mmol/L   BUN 10 6 - 20 mg/dL   Creatinine, Ser 0.60 0.44 - 1.00 mg/dL   Glucose, Bld 108 (H) 70 - 99 mg/dL   Calcium, Ion 1.24 1.15 - 1.40 mmol/L   TCO2 25 22 - 32 mmol/L   Hemoglobin 12.2 12.0 - 15.0 g/dL   HCT 36.0 36.0 - 46.0 %    No results found.  Assessment/Plan: Uterine fibroids Menorrhagia P) dx hysteroscopy, transcervical ablation of uterine fibroids using Sonata treatment. Risk of procedure reviewed including infection, bleeding, inability to assess all fibroids due to location,  injury to surrounding organ structures. All ? answered  Kayon Dozier A Raife Lizer 12/28/2020, 10:25 AM

## 2020-12-28 NOTE — Brief Op Note (Signed)
12/28/2020  2:09 PM  PATIENT:  Darlene Berg  45 y.o. female  PRE-OPERATIVE DIAGNOSIS:  symptomatic uterine fibroids  POST-OPERATIVE DIAGNOSIS:  symptomatic uterine fibroids  PROCEDURE:  Diagnostic hysteroscopy, radiofrequency transcervical ablation of fibroid using Sonata treatment, hysteroscopic resection of SM fibroid using Reach myosure resectoscope, D&C  SURGEON:  Surgeon(s) and Role:    * Sylis Ketchum, Alanda Slim, MD - Primary  PHYSICIAN ASSISTANT:   ASSISTANTS: none   ANESTHESIA:   general  Findings: distorted endometrial cavity due to SM fibroids( posterior fundal cluster, ant fundal, left lateral( largest) encompass the entire wall.  Right tubal ostia seen. Left not seen EBL:  70ml    BLOOD ADMINISTERED:none  DRAINS: none   LOCAL MEDICATIONS USED:  NONE  SPECIMEN:  Source of Specimen:  endometrial curetting with fibroid resection  DISPOSITION OF SPECIMEN:  PATHOLOGY  COUNTS:  YES  TOURNIQUET:  * No tourniquets in log *  DICTATION: .Other Dictation: Dictation Number [id number]  PLAN OF CARE: Discharge to home after PACU  PATIENT DISPOSITION:  PACU - hemodynamically stable.   Delay start of Pharmacological VTE agent (>24hrs) due to surgical blood loss or risk of bleeding: no

## 2020-12-28 NOTE — Anesthesia Procedure Notes (Deleted)
Performed by: Chea Malan, Jinger Neighbors, CRNA

## 2020-12-28 NOTE — Anesthesia Procedure Notes (Deleted)
Performed by: Abigal Choung, Jinger Neighbors, CRNA

## 2020-12-28 NOTE — Discharge Instructions (Signed)
CALL  IF TEMP>100.4, NOTHING PER VAGINA X 2 WK, CALL IF SOAKING A MAXI  PAD EVERY HOUR OR MORE FREQUENTLYCALL  IF TEMP>100.4, NOTHING PER VAGINA X 2 WK, CALL IF SOAKING A MAXI  PAD EVERY HOUR OR MORE FREQUENTLYDISCHARGE INSTRUCTIONS: D&CDISCHARGE INSTRUCTIONS: HYSTEROSCOPY / ENDOMETRIAL ABLATION The following instructions have been prepared to help you care for yourself upon your return home.  May take Ibuprofen after 6pm.  May take stool softner while taking narcotic pain medication to prevent constipation.  Drink plenty of water.  Personal hygiene: Marland Kitchen Use sanitary pads for vaginal drainage, not tampons. . Shower the day after your procedure. . NO tub baths, pools or Jacuzzis for 2-3 weeks. . Wipe front to back after using the bathroom.  Activity and limitations: . Do NOT drive or operate any equipment for 24 hours. The effects of anesthesia are still present and drowsiness may result. . Do NOT rest in bed all day. . Walking is encouraged. . Walk up and down stairs slowly. . You may resume your normal activity in one to two days or as indicated by your physician. Sexual activity: NO intercourse for at least 2 weeks after the procedure, or as indicated by your Doctor.  Diet: Eat a light meal as desired this evening. You may resume your usual diet tomorrow.  Return to Work: You may resume your work activities in one to two days or as indicated by Marine scientist.  What to expect after your surgery: Expect to have vaginal bleeding/discharge for 2-3 days and spotting for up to 10 days. It is not unusual to have soreness for up to 1-2 weeks. You may have a slight burning sensation when you urinate for the first day. Mild cramps may continue for a couple of days. You may have a regular period in 2-6 weeks.  Call your doctor for any of the following: . Excessive vaginal bleeding or clotting, saturating and changing one pad every hour. . Inability to urinate 6 hours after discharge from  hospital. . Pain not relieved by pain medication. . Fever of 100.4 F or greater. . Unusual vaginal discharge or odor.   Personal hygiene: Marland Kitchen Use sanitary pads for vaginal drainage, not tampons. . Shower the day after your procedure. . NO tub baths, pools or Jacuzzis for 2-3 weeks. . Wipe front to back after using the bathroom.  Activity and limitations: . Do NOT drive or operate any equipment for 24 hours. The effects of anesthesia are still present and drowsiness may result. . Do NOT rest in bed all day. . Walking is encouraged. . Walk up and down stairs slowly. . You may resume your normal activity in one to two days or as indicated by your physician.  Sexual activity: NO intercourse for at least 2 weeks after the procedure, or as indicated by your physician.  Diet: Eat a light meal as desired this evening. You may resume your usual diet tomorrow.  Return to work: You may resume your work activities in one to two days or as indicated by your doctor.  What to expect after your surgery: Expect to have vaginal bleeding/discharge for 2-3 days and spotting for up to 10 days. It is not unusual to have soreness for up to 1-2 weeks. You may have a slight burning sensation when you urinate for the first day. Mild cramps may continue for a couple of days. You may have a regular period in 2-6 weeks.  No ibuprofen, Advil, Aleve, Motrin, or naproxen until  after 6pm today if needed.  Call your doctor for any of the following: . Excessive vaginal bleeding, saturating and changing one pad every hour. . Inability to urinate 6 hours after discharge from hospital. . Pain not relieved by pain medication. . Fever of 100.4 F or greater. . Unusual vaginal discharge or odor.   Post Anesthesia Home Care Instructions  Activity: Get plenty of rest for the remainder of the day. A responsible individual must stay with you for 24 hours following the procedure.  For the next 24 hours, DO NOT: -Drive a  car -Paediatric nurse -Drink alcoholic beverages -Take any medication unless instructed by your physician -Make any legal decisions or sign important papers.  Meals: Start with liquid foods such as gelatin or soup. Progress to regular foods as tolerated. Avoid greasy, spicy, heavy foods. If nausea and/or vomiting occur, drink only clear liquids until the nausea and/or vomiting subsides. Call your physician if vomiting continues.  Special Instructions/Symptoms: Your throat may feel dry or sore from the anesthesia or the breathing tube placed in your throat during surgery. If this causes discomfort, gargle with warm salt water. The discomfort should disappear within 24 hours.

## 2020-12-28 NOTE — Anesthesia Preprocedure Evaluation (Signed)
Anesthesia Evaluation  Patient identified by MRN, date of birth, ID band Patient awake    Reviewed: Allergy & Precautions, NPO status , Patient's Chart, lab work & pertinent test results  Airway Mallampati: II  TM Distance: >3 FB Neck ROM: Full    Dental  (+) Teeth Intact   Pulmonary neg pulmonary ROS,    Pulmonary exam normal        Cardiovascular negative cardio ROS   Rhythm:Regular Rate:Normal     Neuro/Psych negative neurological ROS  negative psych ROS   GI/Hepatic negative GI ROS, Neg liver ROS,   Endo/Other  negative endocrine ROS  Renal/GU negative Renal ROS  Female GU complaint fibroids    Musculoskeletal negative musculoskeletal ROS (+)   Abdominal (+)  Abdomen: soft. Bowel sounds: normal.  Peds  Hematology negative hematology ROS (+)   Anesthesia Other Findings   Reproductive/Obstetrics negative OB ROS                             Anesthesia Physical Anesthesia Plan  ASA: II  Anesthesia Plan: General   Post-op Pain Management:    Induction: Intravenous  PONV Risk Score and Plan: 3 and Ondansetron, Dexamethasone, Treatment may vary due to age or medical condition and Midazolam  Airway Management Planned: Mask and LMA  Additional Equipment: None  Intra-op Plan:   Post-operative Plan: Extubation in OR  Informed Consent: I have reviewed the patients History and Physical, chart, labs and discussed the procedure including the risks, benefits and alternatives for the proposed anesthesia with the patient or authorized representative who has indicated his/her understanding and acceptance.     Dental advisory given  Plan Discussed with: CRNA  Anesthesia Plan Comments: (Lab Results      Component                Value               Date                      WBC                      6.4                 10/25/2012                HGB                      12.2                 12/28/2020                HCT                      36.0                12/28/2020                MCV                      92.4                10/25/2012                PLT  403 (H)             10/25/2012           Lab Results      Component                Value               Date                      NA                       140                 12/28/2020                K                        3.8                 12/28/2020                GLUCOSE                  108 (H)             12/28/2020                BUN                      10                  12/28/2020                CREATININE               0.60                12/28/2020                GFRNONAA                 85 (L)              10/25/2012                GFRAA                    >90                 10/25/2012          )        Anesthesia Quick Evaluation

## 2020-12-28 NOTE — Anesthesia Procedure Notes (Signed)
Procedure Name: LMA Insertion Date/Time: 12/28/2020 10:41 AM Performed by: Jatia Musa D, CRNA Pre-anesthesia Checklist: Patient identified, Emergency Drugs available, Suction available and Patient being monitored Patient Re-evaluated:Patient Re-evaluated prior to induction Oxygen Delivery Method: Circle system utilized Preoxygenation: Pre-oxygenation with 100% oxygen Induction Type: IV induction Ventilation: Mask ventilation without difficulty LMA: LMA inserted LMA Size: 3.0 Tube type: Oral Number of attempts: 1 Placement Confirmation: positive ETCO2 and breath sounds checked- equal and bilateral Tube secured with: Tape Dental Injury: Teeth and Oropharynx as per pre-operative assessment

## 2020-12-29 NOTE — Op Note (Signed)
Darlene Berg, STEINHAUSER MEDICAL RECORD NO: [id number] ACCOUNT NO: [id number] DATE OF BIRTH: Dec 06, 1975 FACILITY: [facility] LOCATION: [facility] PHYSICIAN: Mirel Hundal A. Garwin Brothers, MD  Operative Report   DATE OF PROCEDURE: 12/28/2020  PREOPERATIVE DIAGNOSIS:  Symptomatic uterine fibroids.  PROCEDURE:  Diagnostic hysteroscopy, hysteroscopic resection of submucosal fibroid, transcervical ablation of uterine fibroids, Dilation and curettage.  POSTOPERATIVE DIAGNOSIS:  Symptomatic uterine fibroids.  ANESTHESIA:  General.  SURGEON:  Maquita Sandoval A. Garwin Brothers, MD  ASSISTANT:  None.  DESCRIPTION OF PROCEDURE:  Under adequate general anesthesia, the patient was placed in the dorsal lithotomy position.  Examination under anesthesia revealed an anteverted uterus about 14-week size, irregular in shape.  No adnexal masses could be appreciated, but limited by the patient's body habitus.  The patient was not catheterized.  Bivalve speculum was placed in the vagina.  A single-tooth tenaculum was placed on the anterior lip of the cervix.  The cervix was then dilated up to #19 [facility]  dilators initially.  A diagnostic hysteroscope was introduced into the uterine cavity.  On the patient's left there was a bulge suggestive of a large submucosal fibroid from a known large 8 cm left lateral subserosal intramural fibroid.  Posterior and  fundal  was a cluster of submucosal fibroids and on the fundal anterior was also a submucosal fibroid.  The right tubal ostia could be seen, the left was not seen at all due to the distortion of the uterine anatomy from the fibroids.  At that point, the diagnostic hysteroscope was removed.  The cervix was further dilated up to #27 [facility] dilator and the [facility] treatment apparatus was then inserted.  A sweep of the uterus was then done, identifying the fibroids and those that were possible to be ablated.   Four fibroids were ablated.  Fibroid #1, which was 7-8 cm at the 11-3 o'clock position  was a fibroid type of 2-5 and located anterior mid fundal.  Three ablation was done in that fibroid. Ablation #1 was at 3.8 x 3.2 cm of time of 5 minutes, the second  was 3.7 x 2.8 with a time of 4.24 minutes and then ablation #3 of 3.8 x 3.1 lasting for 4 minutes and 48 seconds.  Fibroid #2, which was excised 2 located at the 11 o'clock position about 3-4 cm in size, also in the similar location adjacent to the other  lateral fibroid that had an ablation size of 4.4 x 3.6 cm with a time of 5 minutes and 54 seconds.  Fibroid #3 was 4 cm x 3 cm at about the 10 o'clock position.  It was ablated x2.  Size of 4.1 x 3.3 cm with the time of 5 minutes and 18 seconds.   Ablation #2 was 3.9 x 2.2 cm of the ablative time of 4 minutes and 46 seconds.  The last fibroid of which was located at the 6 o'clock position approximately about 5 cm.  Had an ablated size of 3.3 x 3.4 cm with a time of 3 minutes and 30 seconds.  Once  that was done, other fibroids were viewed, but were not in a position nor was the apparatus.  We were able to manipulate in order to perform additional fibroids, transcervical ablation.  At that point, the apparatus was removed.  The diagnostic  hysteroscope was reinserted and the cavity was inspected.  Evidence of necrosis was noted, particularly on the left as well as the fundal area.  The previous submucosal fundal fibroid was not seen.  The posterior flap of  fibroid that was seen earlier had  only a small portion of the left, at which time, decision was made to resect.  Using the Reach MyoSure, the posterior fibroid was resected as well as the rest of the endometrium.  The procedure was felt to be complete.  All instruments were then remove from the vagina.  During the case the patient had catheterization of her bladder in order to help to facilitate surgery.  SPECIMEN:  Endometrial curettings with fibroid resection sent to pathology.  FLUID DEFICIT:  1450 ml.  ESTIMATED BLOOD LOSS: 5  mL.  COMPLICATION:  None.  The patient tolerated the procedure well and was transferred to recovery room in stable condition.   PUS D: 12/29/2020 8:31:54 am T: 12/29/2020 3:03:00 pm  JOB: [id number]/ [id number]

## 2020-12-31 ENCOUNTER — Encounter (HOSPITAL_BASED_OUTPATIENT_CLINIC_OR_DEPARTMENT_OTHER): Payer: Self-pay | Admitting: Obstetrics and Gynecology

## 2020-12-31 LAB — SURGICAL PATHOLOGY

## 2021-06-14 ENCOUNTER — Telehealth: Payer: 59 | Admitting: Family Medicine

## 2021-07-09 ENCOUNTER — Ambulatory Visit
Admission: EM | Admit: 2021-07-09 | Discharge: 2021-07-09 | Disposition: A | Discharge status: Home / Self Care | Payer: 59 | Attending: Physician Assistant | Admitting: Physician Assistant

## 2021-07-09 ENCOUNTER — Ambulatory Visit: Admit: 2021-07-09 | Disposition: A | Discharge status: Home / Self Care | Payer: 59

## 2021-07-09 DIAGNOSIS — J209 Acute bronchitis, unspecified: Secondary | ICD-10-CM | POA: Diagnosis not present

## 2021-07-09 MED ORDER — PREDNISONE 20 MG PO TABS: 40.0000 mg | ORAL_TABLET | Freq: Every day | ORAL | 0 refills | Status: AC

## 2021-07-09 NOTE — ED Triage Notes (Signed)
Pt reports that she's had an ongoing productive cough and congestion for more than 1 week. Has been taking mucinex with some relief. Negative covid test taken at [facility]. Pt is covid vaccinated but not fully boosted.

## 2021-07-09 NOTE — ED Provider Notes (Signed)
[facility]    CSN: [id number] Arrival date & time: 07/09/21  1433      History   Chief Complaint Chief Complaint  Patient presents with   Cough   Nasal Congestion    HPI Darlene Berg is a 45 y.o. female.   Patient here today for evaluation of persistent cough this been ongoing for at least a week.  She reports that she has had some sinus congestion as well.  She has taken Mucinex with mild relief of symptoms.  She denies any fever or chills.  She has not had any nausea, vomiting, or diarrhea.  The history is provided by the patient.  Cough Associated symptoms: no chills, no ear pain, no eye discharge, no fever, no shortness of breath, no sore throat and no wheezing    Past Medical History:  Diagnosis Date   Ectopic pregnancy 2014   treated medically   Heart murmur    "mild since birth no cardiologist" per pt   Uterine fibroid    Wears glasses     There are no problems to display for this patient.   Past Surgical History:  Procedure Laterality Date   DILATATION & CURETTAGE/HYSTEROSCOPY WITH MYOSURE N/A 12/28/2020   Procedure: DILATATION & CURETTAGE/HYSTEROSCOPY WITH MYOSURE;  Surgeon: Servando Salina, MD;  Location: [facility];  Service: Gynecology;  Laterality: N/A;   NO PAST SURGERIES      OB History     Gravida  3   Para  0   Term  0   Preterm  0   AB  1   Living  0      SAB  0   IAB  1   Ectopic  0   Multiple  0   Live Births               Home Medications    Prior to Admission medications   Medication Sig Start Date End Date Taking? Authorizing Provider  predniSONE (DELTASONE) 20 MG tablet Take 2 tablets (40 mg total) by mouth daily with breakfast for 5 days. 07/09/21 07/14/21 Yes Francene Finders, PA-C  ibuprofen (ADVIL,MOTRIN) 800 MG tablet Take 1 tablet (800 mg total) by mouth 3 (three) times daily. Patient taking differently: Take 800 mg by mouth every 6 (six) hours as needed. Only takes  with menustral cycle 03/26/17   Tasia Catchings, Amy V, PA-C  Multiple Vitamin (MULTIVITAMIN WITH MINERALS) TABS tablet Take 1 tablet by mouth daily. Equate mvi    [provider]  NORETHINDRONE PO Take by mouth. Takes am or pm it varies    [provider]    Family History Family History  Problem Relation Age of Onset   Hypertension Mother    Other Neg Hx     Social History Social History   Tobacco Use   Smoking status: Never   Smokeless tobacco: Never  Vaping Use   Vaping Use: Never used  Substance Use Topics   Alcohol use: Yes    Alcohol/week: 3.0 standard drinks    Types: 3 Shots of liquor per week    Comment: occ   Drug use: No     Allergies   Doxycycline   Review of Systems Review of Systems  Constitutional:  Negative for chills and fever.  HENT:  Positive for congestion and sinus pressure. Negative for ear pain and sore throat.   Eyes:  Negative for discharge and redness.  Respiratory:  Positive for cough. Negative for shortness  of breath and wheezing.   Gastrointestinal:  Negative for abdominal pain, diarrhea, nausea and vomiting.    Physical Exam Triage Vital Signs ED Triage Vitals  Enc Vitals Group     BP 07/09/21 1451 117/79     Pulse Rate 07/09/21 1451 78     Resp 07/09/21 1451 18     Temp 07/09/21 1451 98.3 F (36.8 C)     Temp Source 07/09/21 1451 Oral     SpO2 07/09/21 1451 96 %     Weight --      Height --      Head Circumference --      Peak Flow --      Pain Score 07/09/21 1452 0     Pain Loc --      Pain Edu? --      Excl. in [place]? --    No data found.  Updated Vital Signs BP 117/79 (BP Location: Left Arm)   Pulse 78   Temp 98.3 F (36.8 C) (Oral)   Resp 18   SpO2 96%   Physical Exam Vitals and nursing note reviewed.  Constitutional:      General: She is not in acute distress.    Appearance: Normal appearance. She is not ill-appearing.  HENT:     Head: Normocephalic and atraumatic.     Right Ear: Tympanic membrane  normal.     Left Ear: Tympanic membrane normal.     Nose: Congestion present.     Mouth/Throat:     Mouth: Mucous membranes are moist.     Pharynx: No oropharyngeal exudate or posterior oropharyngeal erythema.  Eyes:     Conjunctiva/sclera: Conjunctivae normal.  Cardiovascular:     Rate and Rhythm: Normal rate and regular rhythm.     Heart sounds: Normal heart sounds. No murmur heard. Pulmonary:     Effort: Pulmonary effort is normal. No respiratory distress.     Breath sounds: Normal breath sounds. No wheezing, rhonchi or rales.  Skin:    General: Skin is warm and dry.  Neurological:     Mental Status: She is alert.  Psychiatric:        Mood and Affect: Mood normal.        Thought Content: Thought content normal.     UC Treatments / Results  Labs (all labs ordered are listed, but only abnormal results are displayed) Labs Reviewed - No data to display  EKG   Radiology No results found.  Procedures Procedures (including critical care time)  Medications Ordered in UC Medications - No data to display  Initial Impression / Assessment and Plan / UC Course  I have reviewed the triage vital signs and the nursing notes.  Pertinent labs & imaging results that were available during my care of the patient were reviewed by me and considered in my medical decision making (see chart for details).  Suspect symptoms are likely related to bronchitis, and steroid burst prescribed for same.  Encouraged follow-up if no gradual improvement or if symptoms worsen in any way.  Final Clinical Impressions(s) / UC Diagnoses   Final diagnoses:  Acute bronchitis, unspecified organism   Discharge Instructions   None    ED Prescriptions     Medication Sig Dispense Auth. Provider   predniSONE (DELTASONE) 20 MG tablet Take 2 tablets (40 mg total) by mouth daily with breakfast for 5 days. 10 tablet Francene Finders, PA-C      PDMP not reviewed this encounter.   Doyle Askew,  Sallee Lange,  PA-C 07/09/21 1524

## 2023-07-09 ENCOUNTER — Encounter (HOSPITAL_COMMUNITY): Payer: Self-pay

## 2023-07-09 ENCOUNTER — Ambulatory Visit (HOSPITAL_COMMUNITY)
Admission: EM | Admit: 2023-07-09 | Discharge: 2023-07-09 | Disposition: A | Discharge status: Home / Self Care | Payer: 59 | Attending: Emergency Medicine | Admitting: Emergency Medicine

## 2023-07-09 DIAGNOSIS — Z1152 Encounter for screening for COVID-19: Secondary | ICD-10-CM | POA: Insufficient documentation

## 2023-07-09 DIAGNOSIS — J069 Acute upper respiratory infection, unspecified: Secondary | ICD-10-CM | POA: Diagnosis not present

## 2023-07-09 MED ORDER — BENZONATATE 100 MG PO CAPS: 100.0000 mg | ORAL_CAPSULE | Freq: Three times a day (TID) | ORAL | 0 refills | Status: AC

## 2023-07-09 NOTE — Discharge Instructions (Addendum)
COVID test sent out - we will contact you with results. Take Tessalon as prescribed to help with cough. Can also take OTC medicine. Rest and keep hydrated. Follow-up with PCP if no improvement in a week.  Go to the ER if develop difficulty breathing or chest pain.

## 2023-07-09 NOTE — ED Provider Notes (Signed)
[facility]    CSN: [id number] Arrival date & time: 07/09/23  0801      History   Chief Complaint Chief Complaint  Patient presents with   Cough    HPI Darlene Berg is a 47 y.o. female.   Patient presents with concerns of feeling unwell since Monday. She reports fatigue, nasal congestion, rhinorrhea, sneezing, sore throat, and cough. She states the sore throat has mostly resolved. Her primary concern is the cough as it causes her difficulty sleeping. The patient reports the cough is mostly dry. She did have one episode of post-tussive emesis. The patient denies fever, headache, body aches, difficulty breathing, or history of asthma/COPD/lung problems. The patient denies specific known sick contacts but thinks she got sick from someone at the grocery store. She has had lemon and ginger tea but not tried/taken anything else for her symptoms.   The history is provided by the patient.  Cough Associated symptoms: rhinorrhea and sore throat   Associated symptoms: no chest pain, no ear pain, no fever, no headaches, no myalgias, no rash and no shortness of breath     Past Medical History:  Diagnosis Date   Ectopic pregnancy 2014   treated medically   Heart murmur    "mild since birth no cardiologist" per pt   Uterine fibroid    Wears glasses     There are no problems to display for this patient.   Past Surgical History:  Procedure Laterality Date   DILATATION & CURETTAGE/HYSTEROSCOPY WITH MYOSURE N/A 12/28/2020   Procedure: DILATATION & CURETTAGE/HYSTEROSCOPY WITH MYOSURE;  Surgeon: Maxie Better, MD;  Location: [facility];  Service: Gynecology;  Laterality: N/A;   NO PAST SURGERIES      OB History     Gravida  3   Para  0   Term  0   Preterm  0   AB  1   Living  0      SAB  0   IAB  1   Ectopic  0   Multiple  0   Live Births               Home Medications    Prior to Admission medications   Medication Sig  Start Date End Date Taking? Authorizing Provider  benzonatate (TESSALON) 100 MG capsule Take 1 capsule (100 mg total) by mouth every 8 (eight) hours. 07/09/23  Yes Zykia Walla L, PA  ibuprofen (ADVIL,MOTRIN) 800 MG tablet Take 1 tablet (800 mg total) by mouth 3 (three) times daily. Patient taking differently: Take 800 mg by mouth every 6 (six) hours as needed. Only takes with menustral cycle 03/26/17   Cathie Hoops, Macon Sandiford V, PA-C  Multiple Vitamin (MULTIVITAMIN WITH MINERALS) TABS tablet Take 1 tablet by mouth daily. Equate mvi    [provider]    Family History Family History  Problem Relation Age of Onset   Hypertension Mother    Other Neg Hx     Social History Social History   Tobacco Use   Smoking status: Never   Smokeless tobacco: Never  Vaping Use   Vaping status: Never Used  Substance Use Topics   Alcohol use: Yes    Alcohol/week: 3.0 standard drinks of alcohol    Types: 3 Shots of liquor per week    Comment: occ   Drug use: No     Allergies   Doxycycline   Review of Systems Review of Systems  Constitutional:  Positive for fatigue. Negative  for fever.  HENT:  Positive for congestion, rhinorrhea, sneezing and sore throat. Negative for ear pain.   Respiratory:  Positive for cough. Negative for shortness of breath.   Cardiovascular:  Negative for chest pain.  Gastrointestinal:  Negative for diarrhea, nausea and vomiting.  Musculoskeletal:  Negative for myalgias.  Skin:  Negative for rash.  Neurological:  Negative for dizziness and headaches.     Physical Exam Triage Vital Signs ED Triage Vitals  Encounter Vitals Group     BP 07/09/23 0815 124/63     Systolic BP Percentile --      Diastolic BP Percentile --      Pulse Rate 07/09/23 0815 83     Resp 07/09/23 0815 16     Temp 07/09/23 0815 98.7 F (37.1 C)     Temp Source 07/09/23 0815 Oral     SpO2 07/09/23 0815 96 %     Weight 07/09/23 0815 165 lb (74.8 kg)     Height 07/09/23 0815 5\' 1"  (1.549 m)      Head Circumference --      Peak Flow --      Pain Score 07/09/23 0814 0     Pain Loc --      Pain Education --      Exclude from Growth Chart --    No data found.  Updated Vital Signs BP 124/63 (BP Location: Left Arm)   Pulse 83   Temp 98.7 F (37.1 C) (Oral)   Resp 16   Ht 5\' 1"  (1.549 m)   Wt 165 lb (74.8 kg)   LMP 06/26/2023 (Exact Date)   SpO2 96%   BMI 31.18 kg/m   Visual Acuity Right Eye Distance:   Left Eye Distance:   Bilateral Distance:    Right Eye Near:   Left Eye Near:    Bilateral Near:     Physical Exam Vitals and nursing note reviewed.  Constitutional:      General: She is not in acute distress. HENT:     Head: Normocephalic.     Nose: Congestion present. No rhinorrhea.     Mouth/Throat:     Mouth: Mucous membranes are moist.     Pharynx: Oropharynx is clear. No oropharyngeal exudate or posterior oropharyngeal erythema.  Eyes:     Conjunctiva/sclera: Conjunctivae normal.     Pupils: Pupils are equal, round, and reactive to light.  Cardiovascular:     Rate and Rhythm: Normal rate and regular rhythm.     Heart sounds: Normal heart sounds.  Pulmonary:     Effort: Pulmonary effort is normal.     Breath sounds: Normal breath sounds. No wheezing, rhonchi or rales.  Musculoskeletal:     Cervical back: Normal range of motion.  Lymphadenopathy:     Cervical: No cervical adenopathy.  Skin:    Findings: No rash.  Neurological:     Mental Status: She is alert.  Psychiatric:        Mood and Affect: Mood normal.      UC Treatments / Results  Labs (all labs ordered are listed, but only abnormal results are displayed) Labs Reviewed  SARS CORONAVIRUS 2 (TAT 6-24 HRS)    EKG   Radiology No results found.  Procedures Procedures (including critical care time)  Medications Ordered in UC Medications - No data to display  Initial Impression / Assessment and Plan / UC Course  I have reviewed the triage vital signs and the nursing  notes.  Pertinent labs &  imaging results that were available during my care of the patient were reviewed by me and considered in my medical decision making (see chart for details).     S/s consistent with viral URI. COVID sent out. Sx tx and reassurance and discussed return/ER precautions.   E/M: 1 acute uncomplicated illness, 1 data, moderate risk due to prescription management  Final Clinical Impressions(s) / UC Diagnoses   Final diagnoses:  Viral URI with cough     Discharge Instructions      COVID test sent out - we will contact you with results. Take Tessalon as prescribed to help with cough. Can also take OTC medicine. Rest and keep hydrated. Follow-up with PCP if no improvement in a week.  Go to the ER if develop difficulty breathing or chest pain.     ED Prescriptions     Medication Sig Dispense Auth. Provider   benzonatate (TESSALON) 100 MG capsule Take 1 capsule (100 mg total) by mouth every 8 (eight) hours. 21 capsule Vallery Sa, Brandan Glauber L, PA      PDMP not reviewed this encounter.   Estanislado Pandy, [place] 07/09/23 [phone]

## 2023-07-09 NOTE — ED Triage Notes (Signed)
Patient here today with c/o cough, runny nose, congestion, sneezing, and ST that started Sunday. ST went away with Ginger and lemon tea. No sick contacts.

## 2023-07-10 ENCOUNTER — Telehealth (HOSPITAL_COMMUNITY): Payer: Self-pay | Admitting: *Deleted

## 2023-07-10 LAB — SARS CORONAVIRUS 2 (TAT 6-24 HRS): SARS Coronavirus 2: NEGATIVE

## 2023-07-10 NOTE — Telephone Encounter (Signed)
Spoke with provider they advised take mucinex and delsym OTC and if she isnt getting any better she is more than welcome to come back in to rechecked again. She is also aware her COVID is neg.  Pt verbalized understanding.

## 2024-07-19 ENCOUNTER — Other Ambulatory Visit: Payer: Self-pay | Admitting: Obstetrics and Gynecology

## 2024-07-19 ENCOUNTER — Other Ambulatory Visit: Payer: Self-pay | Admitting: Interventional Radiology

## 2024-07-19 DIAGNOSIS — D259 Leiomyoma of uterus, unspecified: Secondary | ICD-10-CM

## 2024-07-29 ENCOUNTER — Ambulatory Visit
Admission: RE | Admit: 2024-07-29 | Discharge: 2024-07-29 | Disposition: A | Discharge status: Home / Self Care | Source: Ambulatory Visit | Attending: Interventional Radiology | Admitting: Interventional Radiology

## 2024-07-29 DIAGNOSIS — D259 Leiomyoma of uterus, unspecified: Secondary | ICD-10-CM

## 2024-08-04 ENCOUNTER — Inpatient Hospital Stay: Admission: RE | Admit: 2024-08-04 | Source: Ambulatory Visit

## 2024-08-27 ENCOUNTER — Ambulatory Visit
Admission: RE | Admit: 2024-08-27 | Discharge: 2024-08-27 | Disposition: A | Discharge status: Home / Self Care | Source: Ambulatory Visit | Attending: Interventional Radiology | Admitting: Interventional Radiology

## 2024-08-27 MED ORDER — GADOPICLENOL 0.5 MMOL/ML IV SOLN
7.5000 mL | Freq: Once | INTRAVENOUS | Status: AC | PRN
  Administered 2024-08-27: 7.5 mL via INTRAVENOUS

## 2024-10-03 NOTE — Consult Note (Signed)
 "      Chief Complaint: Patient was seen in consultation today for symptomatic uterine fibroids at the request of Cousins,Sheronette  Referring Physician(s): Cousins,Sheronette  Consulting Physician: Karalee Beat  History of Present Illness: Darlene Berg is a 49 y.o. female symptomatic uterine fibroids with associated menorrhagia s/p diagnostic hysteroscopy with radiofrequency transcervical ablation of fibroid and resection of submucosal fibroid with D&C in 2022 with Dr. Rutherford. During recent follow up, patient reported worsening abnormal bleeding and menorrhagia without associated pelvic pain. She underwent transvaginal sonogram on 7/1//25 for evaluation of metrorrhagia which revealed a fibroid uterus with multiple fibroid seen. She was subsequently referred to IR for possible uterine artery embolization and underwent MR Pelvis on 11/29 significant for:   Markedly enlarged/bulky lobulated uterus measuring up to 9.1 x 16.8 cm orthogonally on sagittal plane. There are innumerable predominantly intramural and subserosal leiomyomas with largest leiomyoma in the fundal region measuring up to 5.8 x 6.3 cm.  Other leiomyomas exhibit contrast enhancement, suggesting viability. The leiomyomas appear hypointense on T2 weighted images, there is no abnormal diffusion restriction on high B value images, no abnormal contrast washout or irregular margins, which makes leiomyosarcoma very less likely.  She presents today to discuss management options including uterine fibroid embolization. Her current symptom severity score is 28 with an HR-QoL score of 69 indicating moderate severity symptoms moderately impacting her quality of life. Currently her cycle lasts anywhere from 5-7 days with 3-4 days of heavy bleeding. She takes iron daily as recommended by her OBGYN due to her bleeding. She also admits to constant bloating and pressure in her lower abdomen as well as frequent urination. She denies any  recent or frequent UTIs.   Past Medical History:  Diagnosis Date   Ectopic pregnancy 2014   treated medically   Heart murmur    mild since birth no cardiologist per pt   Uterine fibroid    Wears glasses     Past Surgical History:  Procedure Laterality Date   DILATATION & CURETTAGE/HYSTEROSCOPY WITH MYOSURE N/A 12/28/2020   Procedure: DILATATION & CURETTAGE/HYSTEROSCOPY WITH MYOSURE;  Surgeon: Rutherford Gain, MD;  Location: [facility];  Service: Gynecology;  Laterality: N/A;   NO PAST SURGERIES      Allergies: Doxycycline  Medications: Prior to Admission medications  Medication Sig Start Date End Date Taking? Authorizing Provider  benzonatate  (TESSALON ) 100 MG capsule Take 1 capsule (100 mg total) by mouth every 8 (eight) hours. 07/09/23   Vear, Amy L, PA  ibuprofen  (ADVIL ,MOTRIN ) 800 MG tablet Take 1 tablet (800 mg total) by mouth 3 (three) times daily. Patient taking differently: Take 800 mg by mouth every 6 (six) hours as needed. Only takes with menustral cycle 03/26/17   Babara, Amy V, PA-C  Multiple Vitamin (MULTIVITAMIN WITH MINERALS) TABS tablet Take 1 tablet by mouth daily. Equate mvi    [provider]     Family History  Problem Relation Age of Onset   Hypertension Mother    Other Neg Hx     Social History   Socioeconomic History   Marital status: Married    Spouse name: Not on file   Number of children: Not on file   Years of education: Not on file   Highest education level: Not on file  Occupational History   Not on file  Tobacco Use   Smoking status: Never   Smokeless tobacco: Never  Vaping Use   Vaping status: Never Used  Substance and Sexual Activity  Alcohol use: Yes    Alcohol/week: 3.0 standard drinks of alcohol    Types: 3 Shots of liquor per week    Comment: occ   Drug use: No   Sexual activity: Yes    Birth control/protection: Pill  Other Topics Concern   Not on file  Social History Narrative   Not on  file   Social Drivers of Health   Tobacco Use: Low Risk (08/27/2024)   Patient History    Smoking Tobacco Use: Never    Smokeless Tobacco Use: Never    Passive Exposure: Not on file  Financial Resource Strain: Not on file  Food Insecurity: Not on file  Transportation Needs: Not on file  Physical Activity: Not on file  Stress: Not on file  Social Connections: Not on file  Depression (EYV7-0): Not on file  Alcohol Screen: Not on file  Housing: Not on file  Utilities: Not on file  Health Literacy: Not on file    Review of Systems  Gastrointestinal:        Constant lower abdominal pressure/fullness  Genitourinary:  Positive for frequency and menstrual problem.    Vital Signs: BP (!) 147/86 (BP Location: Left Arm, Patient Position: Sitting, Cuff Size: Normal)   Pulse 74   Temp 97.9 F (36.6 C) (Oral)   Resp 16   SpO2 98%    Physical Exam Vitals reviewed.  Constitutional:      Appearance: Normal appearance.  Cardiovascular:     Rate and Rhythm: Normal rate.  Pulmonary:     Effort: Pulmonary effort is normal.  Abdominal:     General: Abdomen is flat.     Palpations: Abdomen is soft.     Tenderness: There is no abdominal tenderness.  Skin:    General: Skin is warm and dry.  Neurological:     Mental Status: She is alert and oriented to person, place, and time.      Imaging: CLINICAL DATA:  History provided by technologist  Dx: Uterine leiomyoma x 5 yrs, unspecified location DILATATION AND CURETTAGE/HYSTEROSCOPY WITH MYOSURE 2022    EXAM: MRI PELVIS WITHOUT AND WITH CONTRAST   TECHNIQUE: Multiplanar multisequence MR imaging of the pelvis was performed both before and after administration of intravenous contrast.   CONTRAST:  7.5 mL of Vueway .   COMPARISON:  Ultrasound from 10/25/2012.   FINDINGS: Urinary Tract: Limited evaluation of bilateral kidneys on coronal T2 weighted images. Kidneys appear within normal limits. No focal mass. No  hydronephrosis. Partially distended urinary bladder appears essentially within normal limits.   Bowel: No disproportionate dilation of visualized bowel loops. Unremarkable appendix.   Vascular/Lymphatic: No pathologically enlarged lymph nodes. No significant vascular abnormality seen.   Reproductive: Markedly enlarged/bulky lobulated uterus measuring up to 9.1 x 16.8 cm orthogonally on sagittal plane. There are innumerable predominantly intramural and subserosal leiomyomas with largest leiomyoma in the fundal region measuring up to 5.8 x 6.3 cm. Other leiomyomas exhibit contrast enhancement, suggesting viability. The leiomyomas appear hypointense on T2 weighted images, there is no abnormal diffusion restriction on high B value images, no abnormal contrast washout or irregular margins, which makes leiomyosarcoma very less likely.   The endometrium is distorted and not visualized.   There are multiple nabothian cysts in the cervix. However, no suspicious cervical or vaginal mass seen. Bilateral ovaries are visualized and appears within normal limits. Adjacent to but separate from the right ovary is a T2 hyperintense nonenhancing 1.9 x 2.7 cm structure, which is favored benign such as paraovarian/paratubal  cyst.   Other: There is tiny fat containing umbilical hernia. Limited evaluation of gallbladder which exhibits small-to-moderate volume gallstones.   Musculoskeletal: No suspicious bone lesions identified.   IMPRESSION: 1. Markedly enlarged/bulky uterus with innumerable leiomyomas, as described above. 2. There is a 1.9 x 2.7 cm T2 hyperintense nonenhancing structure adjacent to but separate from the right ovary, favored benign such as paraovarian/paratubal cyst. 3. Cholelithiasis without imaging signs of acute cholecystitis.     Electronically Signed   By: Ree Molt M.D.   On: 08/29/2024 15:52  Labs:  CBC: No results for input(s): WBC, HGB, HCT, PLT in  the last 8760 hours.  COAGS: No results for input(s): INR, APTT in the last 8760 hours.  BMP: No results for input(s): NA, K, CL, CO2, GLUCOSE, BUN, CALCIUM, CREATININE, GFRNONAA, GFRAA in the last 8760 hours.  Invalid input(s): CMP  LIVER FUNCTION TESTS: No results for input(s): BILITOT, AST, ALT, ALKPHOS, PROT, ALBUMIN in the last 8760 hours.  TUMOR MARKERS: No results for input(s): AFPTM, CEA, CA199, CHROMGRNA in the last 8760 hours.  Assessment and Plan:  49 year old female with a history of symptomatic uterine fibroids s/p diagnostic hysteroscopy with transcervical ablation and resection of submucosal fibroid and D&C in 2022. Currently with recurrence of symptoms and imaging notable for fibroid uterus.   Patient presents with moderate severity symptoms (28 on symptom severity) that are moderately impacting her life (UFS-QoL of 74). Her main concerns are how heavy her current periods are and the abdominal pressure and associated frequent urination she is currently experiencing. Discussed UFE vs surgical option of hysterectomy with the patient who states that she is not interested in surgery at this time.   Discussed, at length, the procedure of uterine fibroid embolization with the patient, including risks and benefits of the procedure as well as 8/10 success rate with bulk symptoms such as her abdominal bloating and frequent urination. Patient verbalized understanding and would like to move forward with UA procedure at this time.   1.) Labs drawn today 2/) Plan for UAE with hypogastric nerve block at [facility] at patient's earliest convenience   Thank you for this interesting consult.  I greatly enjoyed meeting ALDINA PORTA and look forward to participating in their care.  A copy of this report was sent to the requesting provider on this date.  Electronically Signed: Glennon CHRISTELLA Bal 10/04/2024, 9:38 AM   I spent a total of 40  Minutes in face to face in clinical consultation, greater than 50% of which was counseling/coordinating care for fibroids with possible UAE.  "

## 2024-10-04 ENCOUNTER — Other Ambulatory Visit (HOSPITAL_COMMUNITY): Payer: Self-pay | Admitting: Interventional Radiology

## 2024-10-04 ENCOUNTER — Inpatient Hospital Stay
Admission: RE | Admit: 2024-10-04 | Discharge: 2024-10-04 | Attending: Obstetrics and Gynecology | Admitting: Obstetrics and Gynecology

## 2024-10-04 VITALS — BP 147/86 | HR 74 | Temp 97.9°F | Resp 16

## 2024-10-04 DIAGNOSIS — E222 Syndrome of inappropriate secretion of antidiuretic hormone: Secondary | ICD-10-CM

## 2024-10-04 DIAGNOSIS — D259 Leiomyoma of uterus, unspecified: Secondary | ICD-10-CM

## 2024-10-04 HISTORY — PX: IR RADIOLOGIST EVAL & MGMT: IMG5224

## 2024-10-05 LAB — CBC WITH DIFFERENTIAL/PLATELET
Absolute Lymphocytes: 1800 {cells}/uL (ref 850–3900)
Absolute Monocytes: 584 {cells}/uL (ref 200–950)
Basophils Absolute: 48 {cells}/uL (ref 0–200)
Basophils Relative: 0.6 %
Eosinophils Absolute: 72 {cells}/uL (ref 15–500)
Eosinophils Relative: 0.9 %
HCT: 36.6 % (ref 35.9–46.0)
Hemoglobin: 11.5 g/dL — ABNORMAL LOW (ref 11.7–15.5)
MCH: 29.5 pg (ref 27.0–33.0)
MCHC: 31.4 g/dL — ABNORMAL LOW (ref 31.6–35.4)
MCV: 93.8 fL (ref 81.4–101.7)
MPV: 9.8 fL (ref 7.5–12.5)
Monocytes Relative: 7.3 %
Neutro Abs: 5496 {cells}/uL (ref 1500–7800)
Neutrophils Relative %: 68.7 %
Platelets: 437 Thousand/uL — ABNORMAL HIGH (ref 140–400)
RBC: 3.9 Million/uL (ref 3.80–5.10)
RDW: 13 % (ref 11.0–15.0)
Total Lymphocyte: 22.5 %
WBC: 8 Thousand/uL (ref 3.8–10.8)

## 2024-10-05 LAB — COMPLETE METABOLIC PANEL WITHOUT GFR
AG Ratio: 1.4 (calc) (ref 1.0–2.5)
ALT: 14 U/L (ref 6–29)
AST: 15 U/L (ref 10–35)
Albumin: 4 g/dL (ref 3.6–5.1)
Alkaline phosphatase (APISO): 61 U/L (ref 31–125)
BUN: 10 mg/dL (ref 7–25)
CO2: 24 mmol/L (ref 20–32)
Calcium: 8.7 mg/dL (ref 8.6–10.2)
Chloride: 105 mmol/L (ref 98–110)
Creat: 0.67 mg/dL (ref 0.50–0.99)
Globulin: 2.8 g/dL (ref 1.9–3.7)
Glucose, Bld: 97 mg/dL (ref 65–99)
Potassium: 3.8 mmol/L (ref 3.5–5.3)
Sodium: 139 mmol/L (ref 135–146)
Total Bilirubin: 0.3 mg/dL (ref 0.2–1.2)
Total Protein: 6.8 g/dL (ref 6.1–8.1)

## 2024-10-05 LAB — HOUSE ACCOUNT TRACKING

## 2024-10-10 ENCOUNTER — Other Ambulatory Visit: Payer: Self-pay | Admitting: Interventional Radiology

## 2024-10-10 DIAGNOSIS — D259 Leiomyoma of uterus, unspecified: Secondary | ICD-10-CM

## 2024-10-20 NOTE — Discharge Instructions (Signed)
 Uterine Artery Embolization After Care   What can I expect after the procedure?   After the procedure, it is common to have:   Mild pain or discomfort at the arterial entry site   Uterine Cramping   Cramps can vary in strength from what you would consider to be a bad menstrual cycle all the way up to as severe as labor pains.   The cramping is typically the most severe the afternoon and evening the day of the procedure and begin to improve the next day and each day thereafter.   Vaginal bleeding. Your cycle may become irregular the first several months.   Vaginal discharge. We recommend you wear a panty liner for first 4-6 weeks following your procedure.    Nausea and vomiting.      Follow these instructions at home:   Medicines   Take your medicine exactly as told, at the same time every day. This is vital to helping you with a smooth recovery.   Zofran  (ondansetron ) is used to prevent nausea before it starts.  You will have a prescription to take 8 mg of this medication every 8 hours.  You should take this even if you dont feel nauseated because it is meant to prevent the nausea from occurring.  Once you get nauseated and start to vomit, you may not be able to keep your other medicines down and your pain can be left untreated.  You can take this with every other dose of the oxycodone/acetaminophen    Naproxen is a non-steroidal anti-inflammatory medicine called and is critical in keeping your inflammation and pain under control.  You must take this every 12 hrs. We recommend 8 am and 8 pm.    Percocet (oxycodone/acetaminophen ) is a combination narcotic pain medication with Tylenol .  This is to help with your pain.  Take it every 4 hours regardless of your pain level the first 2 days.  Set an alarm to wake up so you dont miss a dose overnight and get behind on your pain control.  After the first 48 hrs, if your pain is minimal you can take only as needed.    Colace (docusate  sodium) is a stool softener to help prevent constipation.  The pain medications often cause constipation which can be particularly uncomfortable after [place].  We recommend you take this at 8 am and 8 pm with your naproxen.    Phenergan  (promethazine ) is another medication for nausea.  If you still feel sick to your stomach or vomit despite the Zofran  (ondansetron ) take this medicine every 8 hours as needed.    Incision care   Leave your bandage on for 24 hrs and keep the area dry   You may remove the bandage after 24 hrs and then shower as normal.    Do not submerge in a bath, pool or hot tub until the small incision is completely healed (5-7 days).   Activity   Do not lift anything that is heavier than 5 lb (2.3 kg)for the first 3 days.     Return to your normal activities after day 5.  Take it slowly and listen to your body. Ask your health care provider what activities are safe for you.   General instructions   Many women find a hot water bottle or heating pad helpful when placed on the lower abdomen.  This is fine to do if it helps your cramps.    Do not use any products that contain nicotine or tobacco.  These products include cigarettes, chewing tobacco, and vaping devices, such as e-cigarettes. These can delay incision healing. If you need help quitting, ask your health care provider.   Do not have sex or put anything in your vagina for at least 14 days.    Do not drink alcohol until your health care provider says it is okay.   Keep all follow-up visits. This is important.      Please contact our office at [phone] for the following symptoms:   You have a fever.   You have more redness, swelling, or pain around your incision.   You have more fluid or blood coming from your incision site.   Your incision feels warm to the touch.   You have pus or a bad smell coming from your incision or vagina.   You have a rash.   You have nausea, or you cannot eat or drink  anything without vomiting.   You have a vaginal discharge that is not getting lighter.   Get help right away if:   You have trouble breathing.   You have chest pain.   You have severe pain in your abdomen, and it does not get better with medicine.   You have leg pain or leg swelling.   You feel dizzy, or you faint.   Do not wait to see if the symptoms will go away.   Do not drive yourself to the hospital.      These symptoms may be an emergency.    Get help right away. Call 911.   Summary   After the procedure, it is common to have cramps, or pain or discomfort at the incision site. You will be given pain medicine.   Follow instructions from your health care provider about how to take care of your incision. Check your incision area every day for signs of infection.   Take over-the-counter and prescription medicines only as told by your health care provider.   Contact your health care provider if you have symptoms of infection or other symptoms that do not get better with treatment.   Thanks for visiting [facility]!

## 2024-10-21 ENCOUNTER — Telehealth: Payer: Self-pay

## 2024-10-21 MED ORDER — ONDANSETRON HCL 8 MG PO TABS: 8.0000 mg | ORAL_TABLET | Freq: Three times a day (TID) | ORAL | 0 refills | Status: AC | PRN

## 2024-10-21 MED ORDER — PROMETHAZINE HCL 12.5 MG PO TABS: 12.5000 mg | ORAL_TABLET | ORAL | 0 refills | Status: AC | PRN

## 2024-10-21 MED ORDER — DOCUSATE SODIUM 100 MG PO CAPS: 100.0000 mg | ORAL_CAPSULE | Freq: Two times a day (BID) | ORAL | 0 refills | Status: AC

## 2024-10-21 MED ORDER — NAPROXEN 500 MG PO TABS: 500.0000 mg | ORAL_TABLET | Freq: Two times a day (BID) | ORAL | 0 refills | Status: AC

## 2024-10-25 ENCOUNTER — Inpatient Hospital Stay
Admission: RE | Admit: 2024-10-25 | Discharge: 2024-10-25 | Disposition: A | Discharge status: Home / Self Care | Source: Ambulatory Visit | Attending: Interventional Radiology | Admitting: Interventional Radiology

## 2024-11-01 ENCOUNTER — Telehealth: Payer: Self-pay

## 2024-11-01 ENCOUNTER — Ambulatory Visit
Admission: RE | Admit: 2024-11-01 | Discharge: 2024-11-01 | Disposition: A | Source: Ambulatory Visit | Attending: Interventional Radiology | Admitting: Interventional Radiology

## 2024-11-01 ENCOUNTER — Other Ambulatory Visit: Payer: Self-pay | Admitting: Interventional Radiology

## 2024-11-01 DIAGNOSIS — G8918 Other acute postprocedural pain: Secondary | ICD-10-CM

## 2024-11-01 DIAGNOSIS — D259 Leiomyoma of uterus, unspecified: Secondary | ICD-10-CM

## 2024-11-01 MED ORDER — MIDAZOLAM HCL (PF) 2 MG/2ML IJ SOLN: 1.0000 mg | INTRAMUSCULAR | Status: DC | PRN

## 2024-11-01 MED ORDER — SODIUM CHLORIDE 0.9 % IV SOLN: INTRAVENOUS | Status: DC

## 2024-11-01 MED ORDER — HYDROMORPHONE HCL 1 MG/ML IJ SOLN
1.0000 mg | Freq: Once | INTRAMUSCULAR | Status: AC
  Administered 2024-11-01: 1 mg via INTRAVENOUS

## 2024-11-01 MED ORDER — IOPAMIDOL (ISOVUE-300) INJECTION 61%
100.0000 mL | Freq: Once | INTRAVENOUS | Status: AC | PRN
  Administered 2024-11-01: 90 mL via INTRA_ARTERIAL

## 2024-11-01 MED ORDER — DEXAMETHASONE SOD PHOSPHATE PF 10 MG/ML IJ SOLN
10.0000 mg | Freq: Once | INTRAMUSCULAR | Status: AC
  Administered 2024-11-01: 10 mg via INTRAVENOUS

## 2024-11-01 MED ORDER — KETOROLAC TROMETHAMINE 30 MG/ML IJ SOLN
30.0000 mg | INTRAMUSCULAR | Status: AC
  Administered 2024-11-01: 30 mg via INTRAVENOUS

## 2024-11-01 MED ORDER — ACETAMINOPHEN 10 MG/ML IV SOLN
1000.0000 mg | Freq: Once | INTRAVENOUS | Status: AC
  Administered 2024-11-01: 1000 mg via INTRAVENOUS

## 2024-11-01 MED ORDER — PANTOPRAZOLE SODIUM 40 MG IV SOLR
40.0000 mg | Freq: Once | INTRAVENOUS | Status: AC
  Administered 2024-11-01: 40 mg via INTRAVENOUS

## 2024-11-01 MED ORDER — FENTANYL CITRATE (PF) 100 MCG/2ML IJ SOLN
INTRAMUSCULAR | Status: AC | PRN
  Administered 2024-11-01 (×2): 25 ug via INTRAVENOUS
  Administered 2024-11-01: 50 ug via INTRAVENOUS

## 2024-11-01 MED ORDER — KETOROLAC TROMETHAMINE 30 MG/ML IJ SOLN
30.0000 mg | Freq: Once | INTRAMUSCULAR | Status: AC
  Administered 2024-11-01: 30 mg via INTRAMUSCULAR

## 2024-11-01 MED ORDER — FLUCONAZOLE 150 MG PO TABS: 150.0000 mg | ORAL_TABLET | Freq: Once | ORAL | 0 refills | Status: AC

## 2024-11-01 MED ORDER — ONDANSETRON HCL 4 MG/2ML IJ SOLN
8.0000 mg | Freq: Once | INTRAMUSCULAR | Status: AC
  Administered 2024-11-01: 8 mg via INTRAVENOUS

## 2024-11-01 MED ORDER — FENTANYL CITRATE (PF) 50 MCG/ML IJ SOSY: 25.0000 ug | PREFILLED_SYRINGE | INTRAMUSCULAR | Status: DC | PRN

## 2024-11-01 MED ORDER — MIDAZOLAM HCL (PF) 2 MG/2ML IJ SOLN
INTRAMUSCULAR | Status: AC | PRN
  Administered 2024-11-01 (×3): 1 mg via INTRAVENOUS

## 2024-11-01 MED ORDER — PROMETHAZINE HCL 25 MG PO TABS
25.0000 mg | ORAL_TABLET | Freq: Once | ORAL | Status: AC
  Administered 2024-11-01: 25 mg via ORAL

## 2024-11-01 MED ORDER — CEFAZOLIN SODIUM-DEXTROSE 2-4 GM/100ML-% IV SOLN
2.0000 g | INTRAVENOUS | Status: AC
  Administered 2024-11-01: 2 g via INTRAVENOUS

## 2024-11-01 MED ORDER — LIDOCAINE-EPINEPHRINE 1 %-1:100000 IJ SOLN
10.0000 mL | Freq: Once | INTRAMUSCULAR | Status: AC
  Administered 2024-11-01: 10 mL via INTRADERMAL

## 2024-11-01 MED ORDER — ROPIVACAINE HCL 5 MG/ML IJ SOLN
30.0000 mL | Freq: Once | INTRAMUSCULAR | Status: AC
  Administered 2024-11-01: 30 mL

## 2024-11-01 NOTE — Sedation Documentation (Signed)
 Celt placed by Dr. Karalee

## 2024-11-01 NOTE — Progress Notes (Signed)
 Pt back in nursing recovery area. Pt still drowsy from procedure but will wake up when spoken to. Pt follows commands, talks in complete sentences and has no complaints at this time. Pt will remain in nurses station until discharged by Radiologist.

## 2024-11-01 NOTE — Discharge Instructions (Signed)
 Uterine Artery Embolization After Care   What can I expect after the procedure?   After the procedure, it is common to have:   Mild pain or discomfort at the arterial entry site   Uterine Cramping   Cramps can vary in strength from what you would consider to be a bad menstrual cycle all the way up to as severe as labor pains.   The cramping is typically the most severe the afternoon and evening the day of the procedure and begin to improve the next day and each day thereafter.   Vaginal bleeding. Your cycle may become irregular the first several months.   Vaginal discharge. We recommend you wear a panty liner for first 4-6 weeks following your procedure.    Nausea and vomiting.      Follow these instructions at home:   Medicines   Take your medicine exactly as told, at the same time every day. This is vital to helping you with a smooth recovery.   Zofran  (ondansetron ) is used to prevent nausea before it starts.  You will have a prescription to take 8 mg of this medication every 8 hours.  You should take this even if you dont feel nauseated because it is meant to prevent the nausea from occurring.  Once you get nauseated and start to vomit, you may not be able to keep your other medicines down and your pain can be left untreated.  You can take this with every other dose of the oxycodone/acetaminophen    Naproxen is a non-steroidal anti-inflammatory medicine called and is critical in keeping your inflammation and pain under control.  You must take this every 12 hrs. We recommend 8 am and 8 pm.    Percocet (oxycodone/acetaminophen ) is a combination narcotic pain medication with Tylenol .  This is to help with your pain.  Take it every 4 hours regardless of your pain level the first 2 days.  Set an alarm to wake up so you dont miss a dose overnight and get behind on your pain control.  After the first 48 hrs, if your pain is minimal you can take only as needed.    Colace (docusate  sodium) is a stool softener to help prevent constipation.  The pain medications often cause constipation which can be particularly uncomfortable after [place].  We recommend you take this at 8 am and 8 pm with your naproxen.    Phenergan  (promethazine ) is another medication for nausea.  If you still feel sick to your stomach or vomit despite the Zofran  (ondansetron ) take this medicine every 8 hours as needed.    Incision care   Leave your bandage on for 24 hrs and keep the area dry   You may remove the bandage after 24 hrs and then shower as normal.    Do not submerge in a bath, pool or hot tub until the small incision is completely healed (5-7 days).   Activity   Do not lift anything that is heavier than 5 lb (2.3 kg)for the first 3 days.     Return to your normal activities after day 5.  Take it slowly and listen to your body. Ask your health care provider what activities are safe for you.   General instructions   Many women find a hot water bottle or heating pad helpful when placed on the lower abdomen.  This is fine to do if it helps your cramps.    Do not use any products that contain nicotine or tobacco.  These products include cigarettes, chewing tobacco, and vaping devices, such as e-cigarettes. These can delay incision healing. If you need help quitting, ask your health care provider.   Do not have sex or put anything in your vagina for at least 14 days.    Do not drink alcohol until your health care provider says it is okay.   Keep all follow-up visits. This is important.      Please contact our office at [phone] for the following symptoms:   You have a fever.   You have more redness, swelling, or pain around your incision.   You have more fluid or blood coming from your incision site.   Your incision feels warm to the touch.   You have pus or a bad smell coming from your incision or vagina.   You have a rash.   You have nausea, or you cannot eat or drink  anything without vomiting.   You have a vaginal discharge that is not getting lighter.   Get help right away if:   You have trouble breathing.   You have chest pain.   You have severe pain in your abdomen, and it does not get better with medicine.   You have leg pain or leg swelling.   You feel dizzy, or you faint.   Do not wait to see if the symptoms will go away.   Do not drive yourself to the hospital.      These symptoms may be an emergency.    Get help right away. Call 911.   Summary   After the procedure, it is common to have cramps, or pain or discomfort at the incision site. You will be given pain medicine.   Follow instructions from your health care provider about how to take care of your incision. Check your incision area every day for signs of infection.   Take over-the-counter and prescription medicines only as told by your health care provider.   Contact your health care provider if you have symptoms of infection or other symptoms that do not get better with treatment.   Thanks for visiting [facility]!

## 2024-11-03 ENCOUNTER — Telehealth: Payer: Self-pay
